# Patient Record
Sex: Male | Born: 1965
Health system: Southern US, Community
[De-identification: ages and names within clinical notes are randomized; demographics above are authoritative.]

## PROBLEM LIST (undated history)

## (undated) DIAGNOSIS — M1990 Unspecified osteoarthritis, unspecified site: Secondary | ICD-10-CM

## (undated) DIAGNOSIS — M109 Gout, unspecified: Secondary | ICD-10-CM

## (undated) DIAGNOSIS — I10 Essential (primary) hypertension: Secondary | ICD-10-CM

## (undated) HISTORY — PX: DISTAL BICEPS TENDON REPAIR: SHX1461

## (undated) HISTORY — PX: ANTERIOR FUSION CERVICAL SPINE: SUR626

## (undated) HISTORY — PX: SHOULDER ARTHROSCOPY W/ ROTATOR CUFF REPAIR: SHX2400

## (undated) HISTORY — PX: KNEE RECONSTRUCTION, MEDIAL PATELLAR FEMORAL LIGAMENT: SHX1898

## (undated) HISTORY — PX: TOE ARTHROPLASTY: SHX6504

## (undated) HISTORY — PX: ACHILLES TENDON SURGERY: SHX542

## (undated) HISTORY — PX: EYE SURGERY: SHX253

---

## 1998-09-03 ENCOUNTER — Emergency Department (HOSPITAL_COMMUNITY): Admission: EM | Admit: 1998-09-03 | Discharge: 1998-09-04 | Payer: Self-pay | Admitting: Emergency Medicine

## 2006-05-28 ENCOUNTER — Encounter: Admission: RE | Admit: 2006-05-28 | Discharge: 2006-05-28 | Payer: Self-pay | Admitting: Internal Medicine

## 2008-03-29 IMAGING — US US ABDOMEN COMPLETE
1 series · 14 of 25 positions shown · non-contrast
Comparison: None.

CLINICAL DATA: Elevated LFT?s.
 ABDOMEN ULTRASOUND:
TECHNIQUE: Complete abdominal ultrasound examination was performed including evaluation of the liver, gallbladder, bile ducts, pancreas, kidneys, spleen, IVC, and abdominal aorta.

[Series 1: unknown · 0.41mm/px · 14 of 85 slices shown]
[im 1/85]
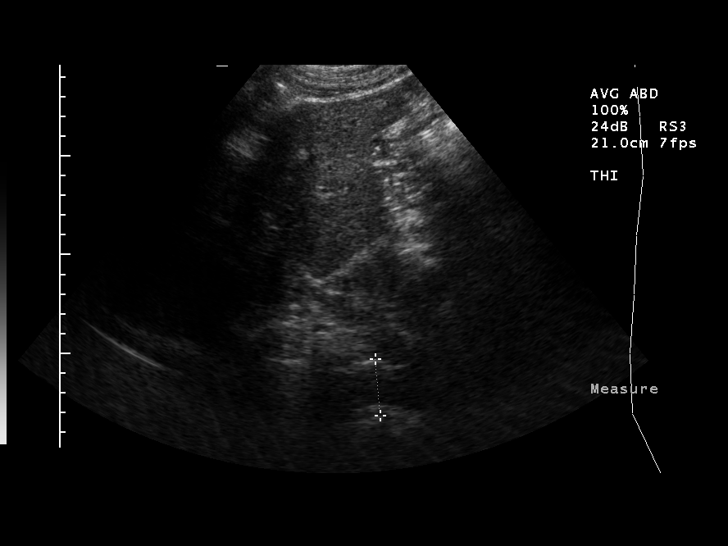
[im 8/85]
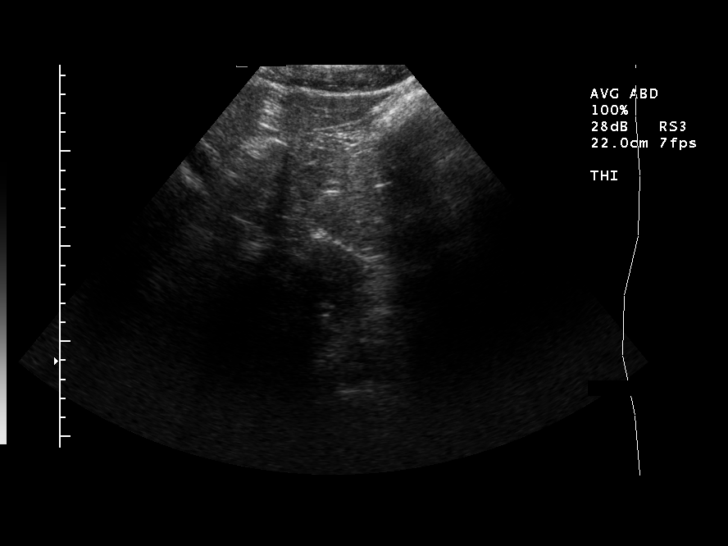
[im 15/85]
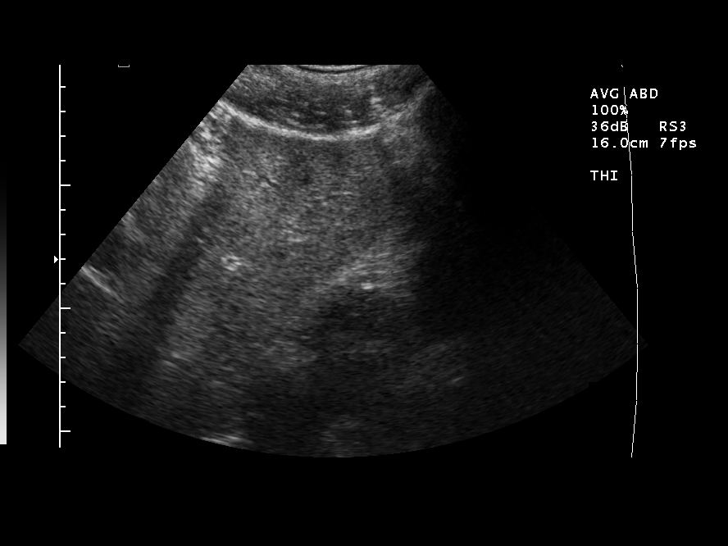
[im 22/85]
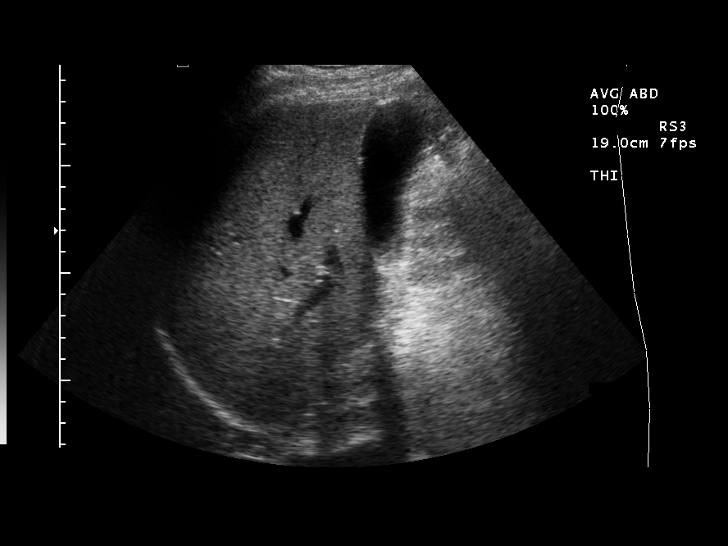
[im 29/85]
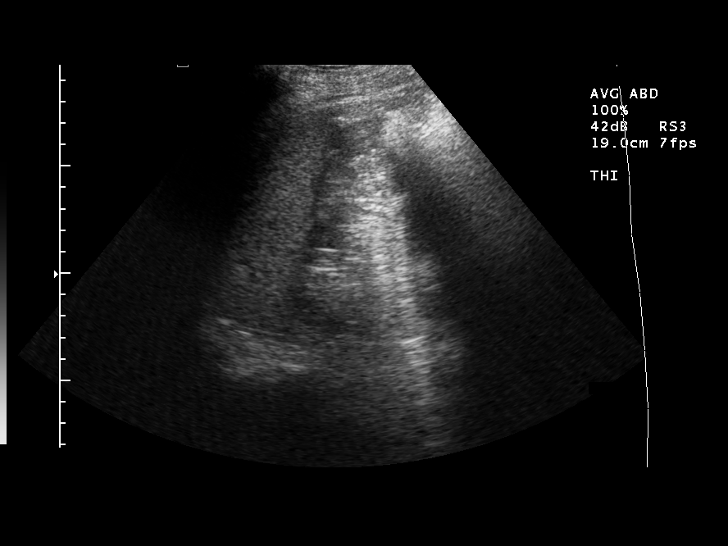
[im 32/85]
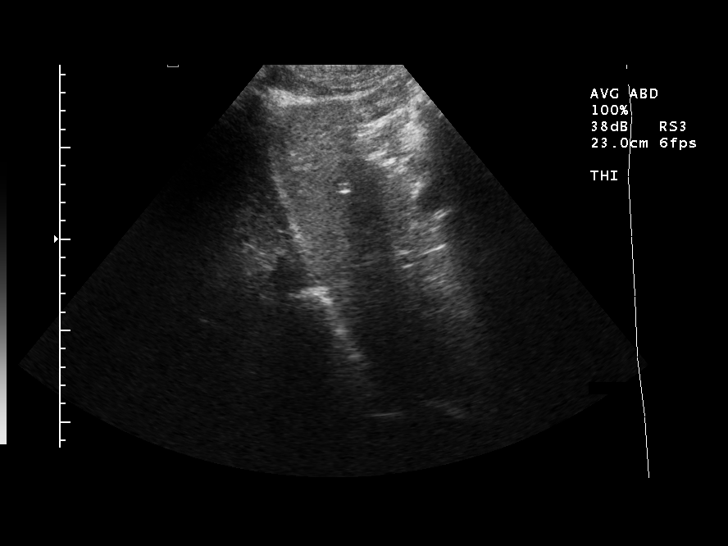
[im 39/85]
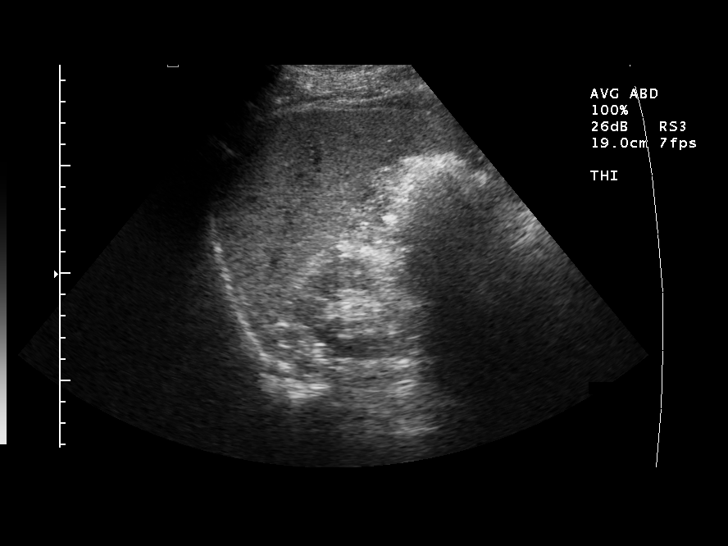
[im 46/85]
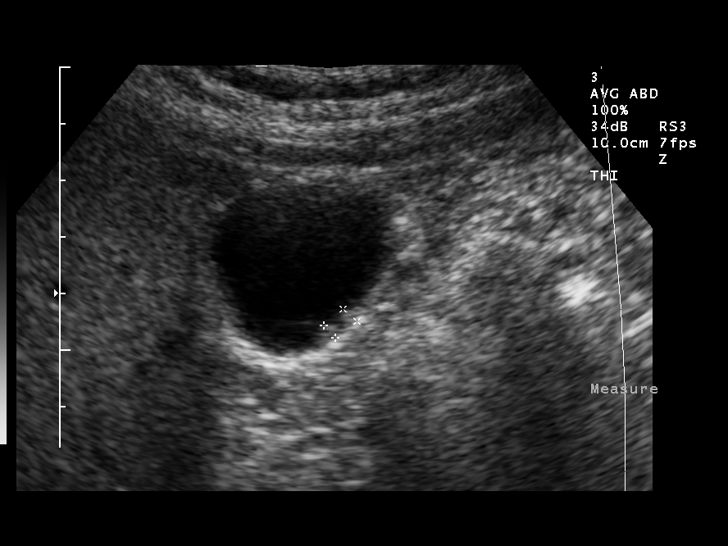
[im 53/85]
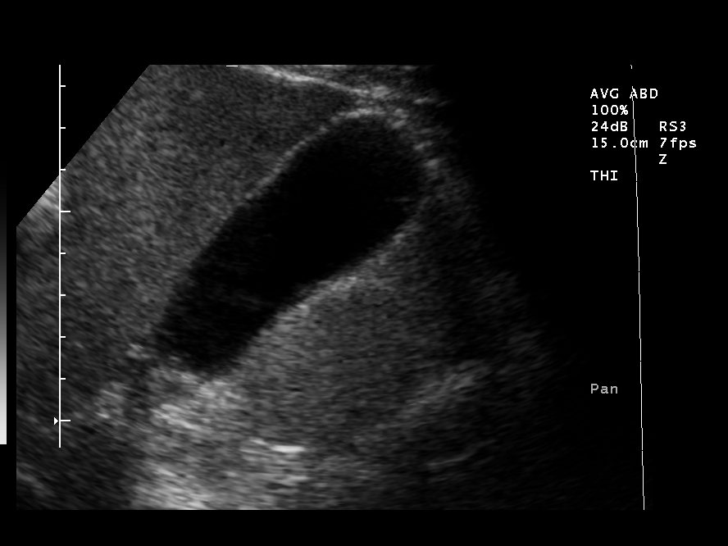
[im 57/85]
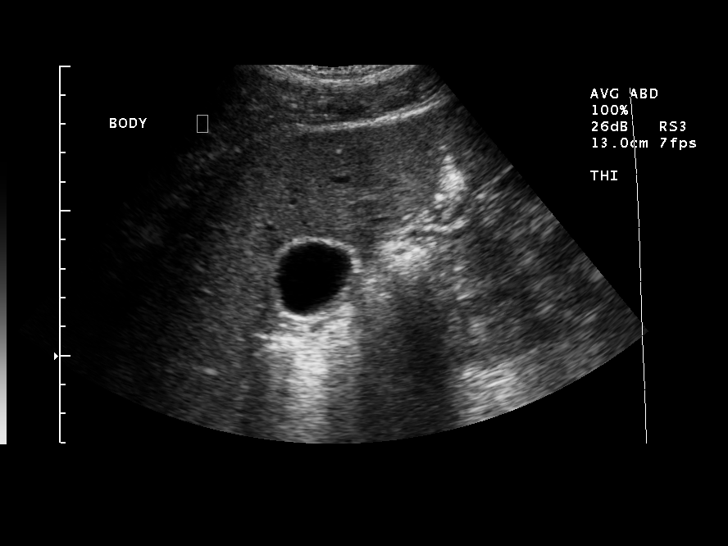
[im 64/85]
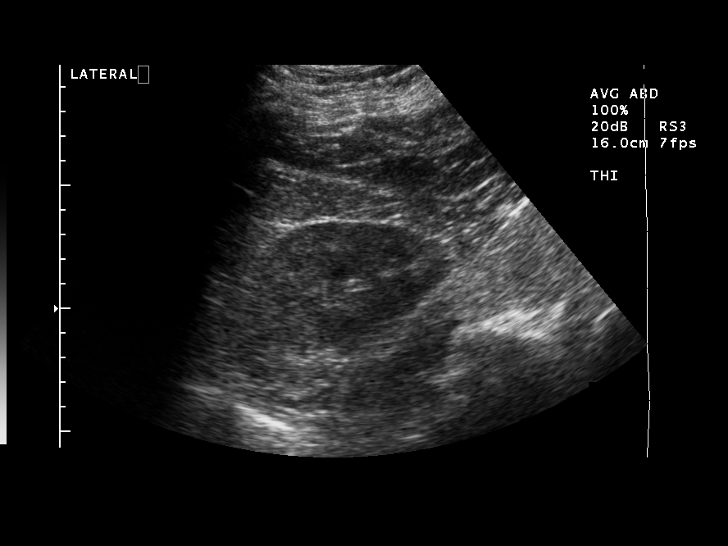
[im 71/85]
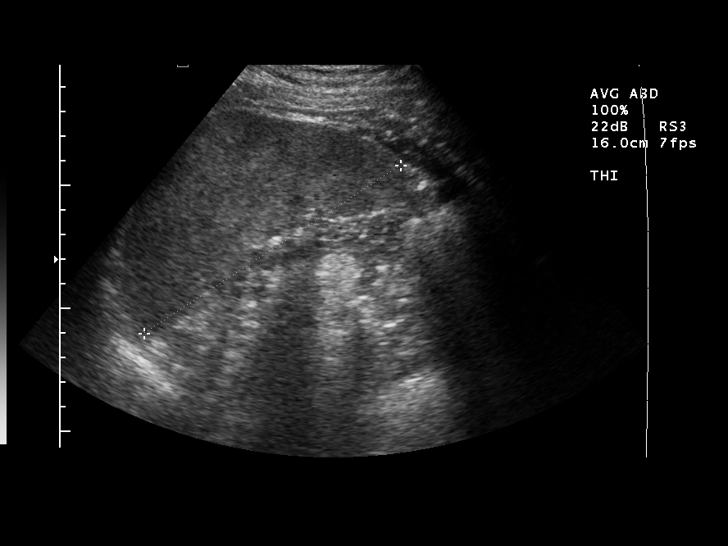
[im 78/85]
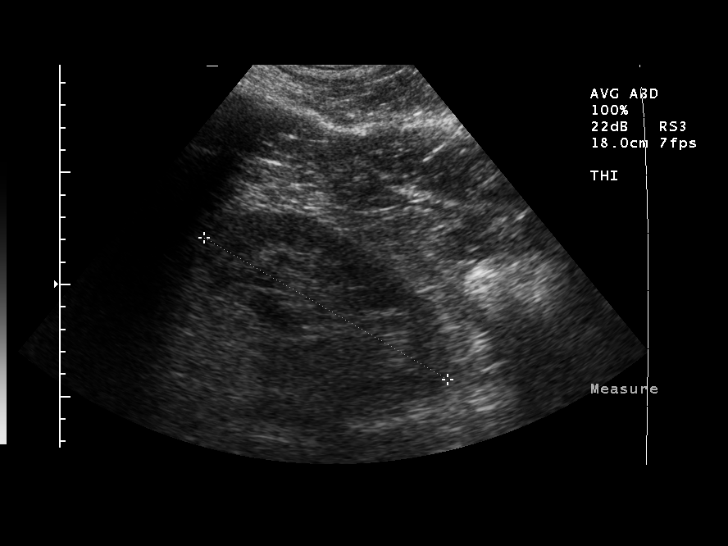
[im 85/85]
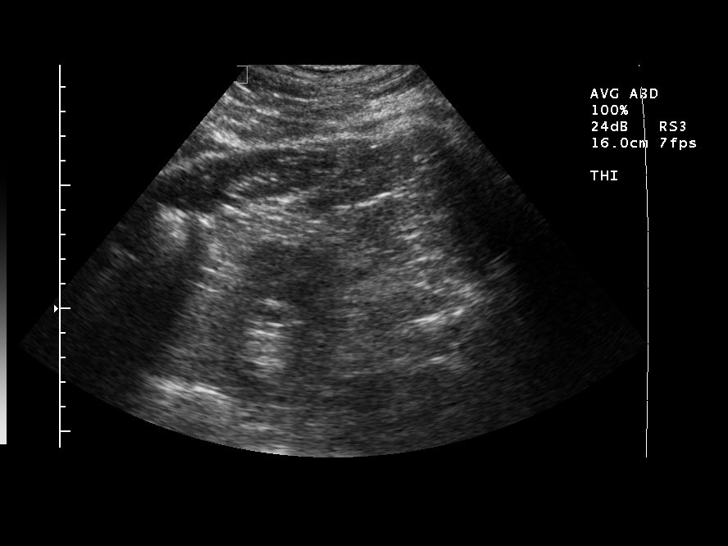

[14 of 25 positions shown; findings below may reference images not displayed]

FINDINGS: The liver is slightly increased in echogenicity diffusely.  There are small polyps along the gallbladder wall which measure less than 5 mm.  Gallbladder wall is not thickened.  No gallstones, pericholecystic fluid, or sonographic Murphy?s sign.  Extrahepatic bile duct measures 3 mm.  IVC and pancreas are not well-visualized.  The spleen measures 12.4 cm.  Kidneys unremarkable.  Abdominal aorta is not well-visualized.  Proximal aorta measures 2.9 cm.
IMPRESSION: 1.  Fatty liver. 
 2.  Mild splenomegaly. 
 3.  Small gallbladder polyps.

## 2008-06-04 ENCOUNTER — Encounter: Admission: RE | Admit: 2008-06-04 | Discharge: 2008-06-04 | Payer: Self-pay | Admitting: Family Medicine

## 2008-11-20 ENCOUNTER — Ambulatory Visit (HOSPITAL_COMMUNITY): Admission: RE | Admit: 2008-11-20 | Discharge: 2008-11-21 | Payer: Self-pay | Admitting: Neurosurgery

## 2010-07-02 LAB — BASIC METABOLIC PANEL
Calcium: 10.2 mg/dL (ref 8.4–10.5)
GFR calc non Af Amer: >60 mL/min (ref >60)
GFR calc non Af Amer: >60 mL/min (ref >60)
Glucose, Bld: 101 mg/dL — ABNORMAL HIGH (ref 70–99)
Glucose, Bld: 96 mg/dL (ref 70–99)
Potassium: 4 mEq/L (ref 3.5–5.1)
Sodium: 136 mEq/L (ref 135–145)
Sodium: 138 mEq/L (ref 135–145)

## 2010-07-02 LAB — CBC
Hemoglobin: 15.8 g/dL (ref 13.0–17.0)
Platelets: 237 10*3/uL (ref 150–400)
RDW: 13.4 % (ref 11.5–15.5)

## 2010-08-09 NOTE — Op Note (Signed)
NAMEDENALI, BECVAR            ACCOUNT NO.:  [id number]   MEDICAL RECORD NO.:  [id number]          PATIENT TYPE:  OIB   LOCATION:  3021                         FACILITY:  [facility]   PHYSICIAN:  Hilda Lias, M.D.   DATE OF BIRTH:  1965-12-28   DATE OF PROCEDURE:  11/20/2008  DATE OF DISCHARGE:                               OPERATIVE REPORT   PREOPERATIVE DIAGNOSES:  1. C5-C6 and C6-C7 degenerative disk disease with chronic      radiculopathy.  2. Ankylosis spondylitis.   POSTOPERATIVE DIAGNOSES:  1. C5-C6 and C6-C7 degenerative disk disease with chronic      radiculopathy.  2. Ankylosis spondylitis.   PROCEDURE:  Anterior C5-C6 and C6-C7 diskectomy, decompression of the  spinal cord, bilateral foraminotomy, interbody fusion with autograft and  allograft plate and microscope.   SURGEON:  Hilda Lias, M.D.   ASSISTANT:  Dr. Newell Coral.   CLINICAL HISTORY:  [name] is a gentleman who has been following in  my office for almost 3 years complaining of neck pain with radiation to  his right upper extremity.  He has a history of ankylosis spondylitis.  The pain is getting worse.  He came with his wife and both of them  decided to go ahead with the surgery.   PROCEDURE IN DETAIL:  The patient was taken to the OR, after intubation,  the left side of the neck was cleaned with DuraPrep.  Transverse  incision was made through the skin, subcutaneous tissue, platysma, down  to the cervical area.  X-ray were done at level of C5-C6 and C6-C7.  We  brought the microscope and we opened the anterior ligament.  The patient  had quite a bit of degenerative disk disease and removal was  accomplished with opening of the posterior ligament and bilateral  foraminotomy.  At the level of C5-6, the disk was completely calcified  and to get into the posterior aspect, we had to use the drill.  At the  end, we were able to open the posterior ligament with decompression of  both thecal nerve  root. The endplate was drilled.  Then 2 pieces of  allograft with 7 mm with autograft inside was inserted just to level  followed by a plate with 6 screws.  Lateral cervical spine showed good  position of the bone graft and plate.  We waited 10 minutes just to be  sure we had good hemostasis.  Once this was accomplished, the wound was  closed with Vicryl and Steri-Strips.           ______________________________  Hilda Lias, M.D.     EB/MEDQ  D:  11/20/2008  T:  11/21/2008  Job:  [id number]

## 2010-08-09 NOTE — H&P (Signed)
NAME:  Gabriel Ballard, Gabriel Ballard            ACCOUNT NO.:  [id number]   MEDICAL RECORD NO.:  [id number]          PATIENT TYPE:  OIB   LOCATION:  3021                         FACILITY:  [facility]   PHYSICIAN:  Hilda Lias, M.D.   DATE OF BIRTH:  [birth year]   DATE OF ADMISSION:  11/20/2008  DATE OF DISCHARGE:                              HISTORY & PHYSICAL   Gabriel Ballard is a gentleman who has been following for more than a year  complaining of neck pain with radiation to the right shoulder and to the  right arm.  At this point, it had been going on since Spring of 2009.  He is not any better.  He has had conservative treatment and he has not  improved.  We repeated the x-ray and showed that in deed he has  spondylosis of the C5-6 and C6-7.  He has a history of ankylosis and  spondylitis.  Because of persistent of pain, he want to proceed with  surgery.   PAST MEDICAL HISTORY:  He has a full shoulder surgery and eye surgery.   ALLERGIES:  He is not allergic to any medication.   FAMILY HISTORY:  Father has a history of high blood pressure and stroke.  Mother with fibromyalgia.   SOCIAL HISTORY:  He does not smoke.  He drinks socially.   REVIEW OF SYSTEMS:  Positive for ankylosis spondylitis, neck pain, arm  pain, high blood pressure.   PHYSICAL EXAMINATION:  HEAD, EARS, NOSE, AND THROAT:  Normal.  NECK:  He has limitation in neck movement secondary to pain.  LUNGS:  Clear.  HEART:  Heart sounds normal.  ABDOMEN:  Normal.  EXTREMITIES:  Normal pulse.  NEUROLOGIC:  He has weakness in the right biceps and wrist extensor.  Reflexes 1+.  No Babinski.  His sensation normal.   1. MRI shows degenerative disk disease at C5-6 and C6-7 with      spondylosis.  2. History of ankylosis spondylitis.   CLINICAL IMPRESSION:  1. Cervical spondylosis C5-6 and C6-7.  2. Ankylosis spondylitis.   RECOMMENDATIONS:  The patient is being admitted for surgery.  Procedure  will be anterior diskectomy and  fusion at C5-6, C6-7 with plate and  graft.  The patient knows about the risks such as infection, CSF leak,  worsening pain, paralysis, need for surgery.           ______________________________  Hilda Lias, M.D.     EB/MEDQ  D:  11/20/2008  T:  11/20/2008  Job:  [id number]

## 2010-09-20 IMAGING — CR DG CHEST 2V
2 series · 2 of 2 positions shown · non-contrast
Comparison: None

CLINICAL DATA: Preadmission

CHEST - 2 VIEW

[view not recorded (1 of 2)]
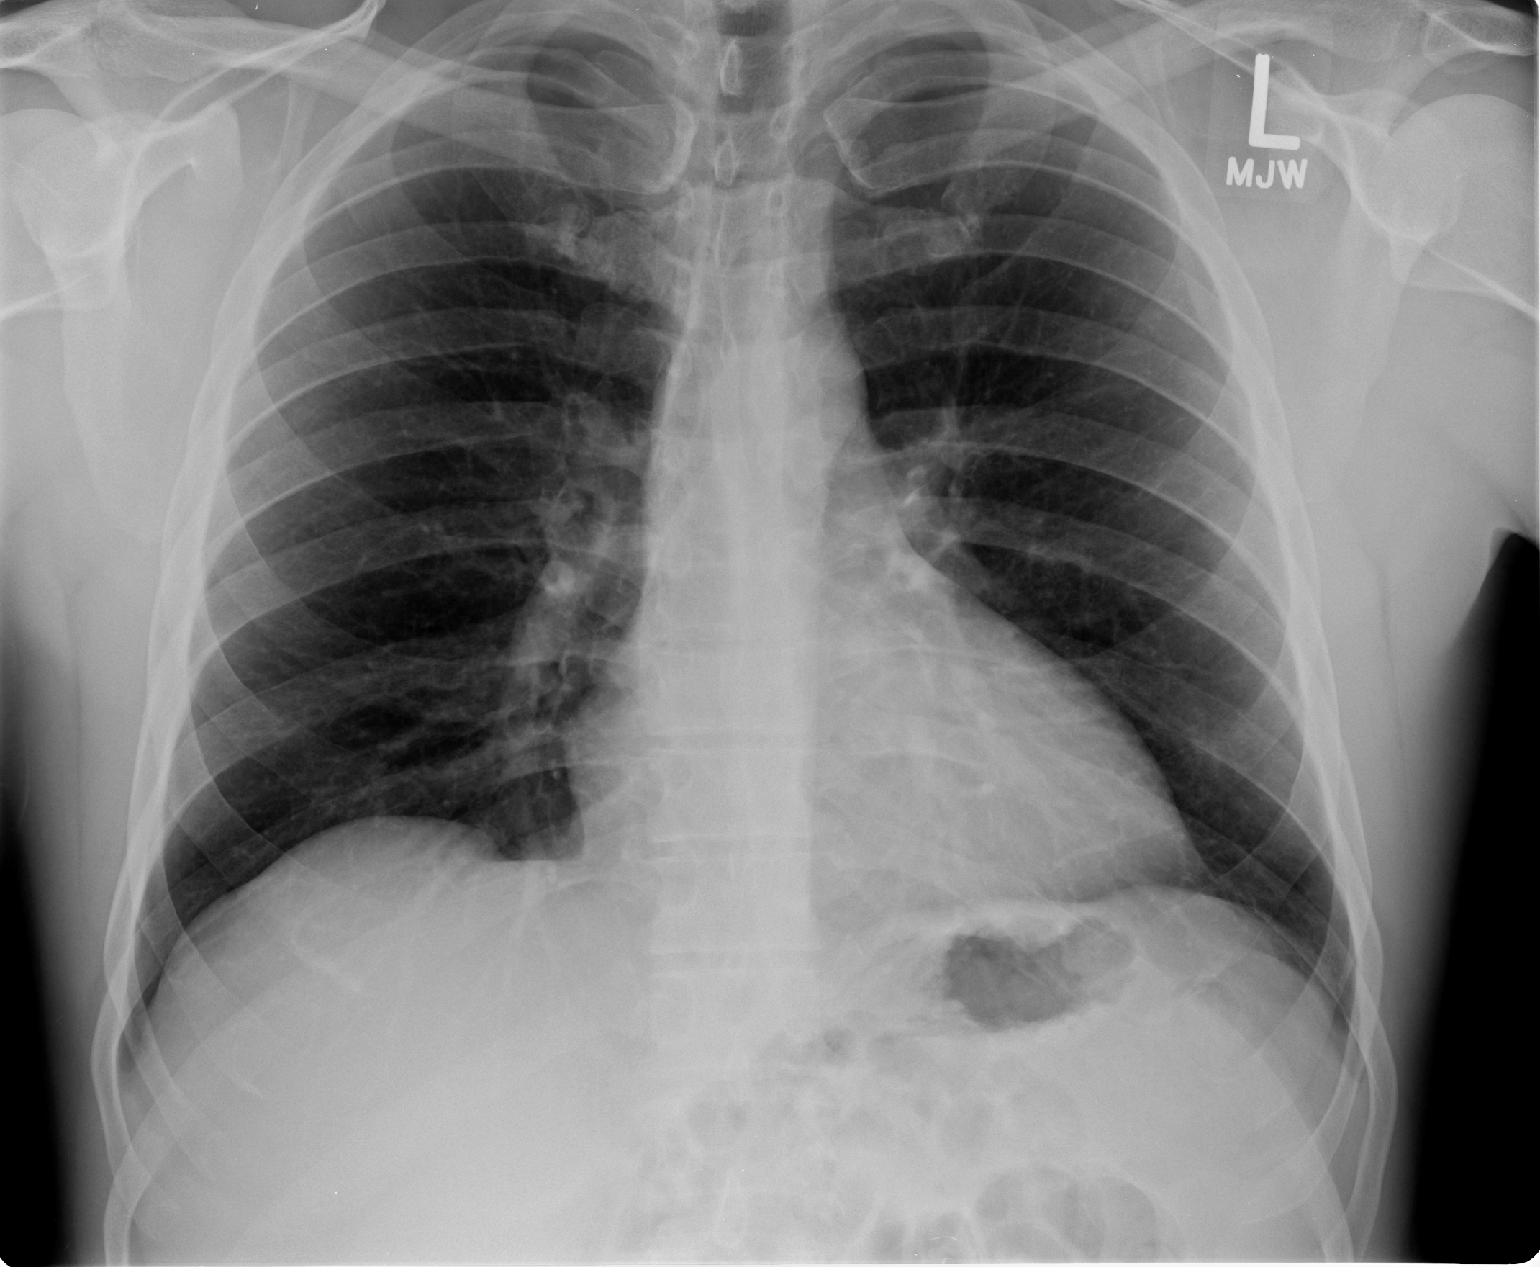

[view not recorded (2 of 2)]
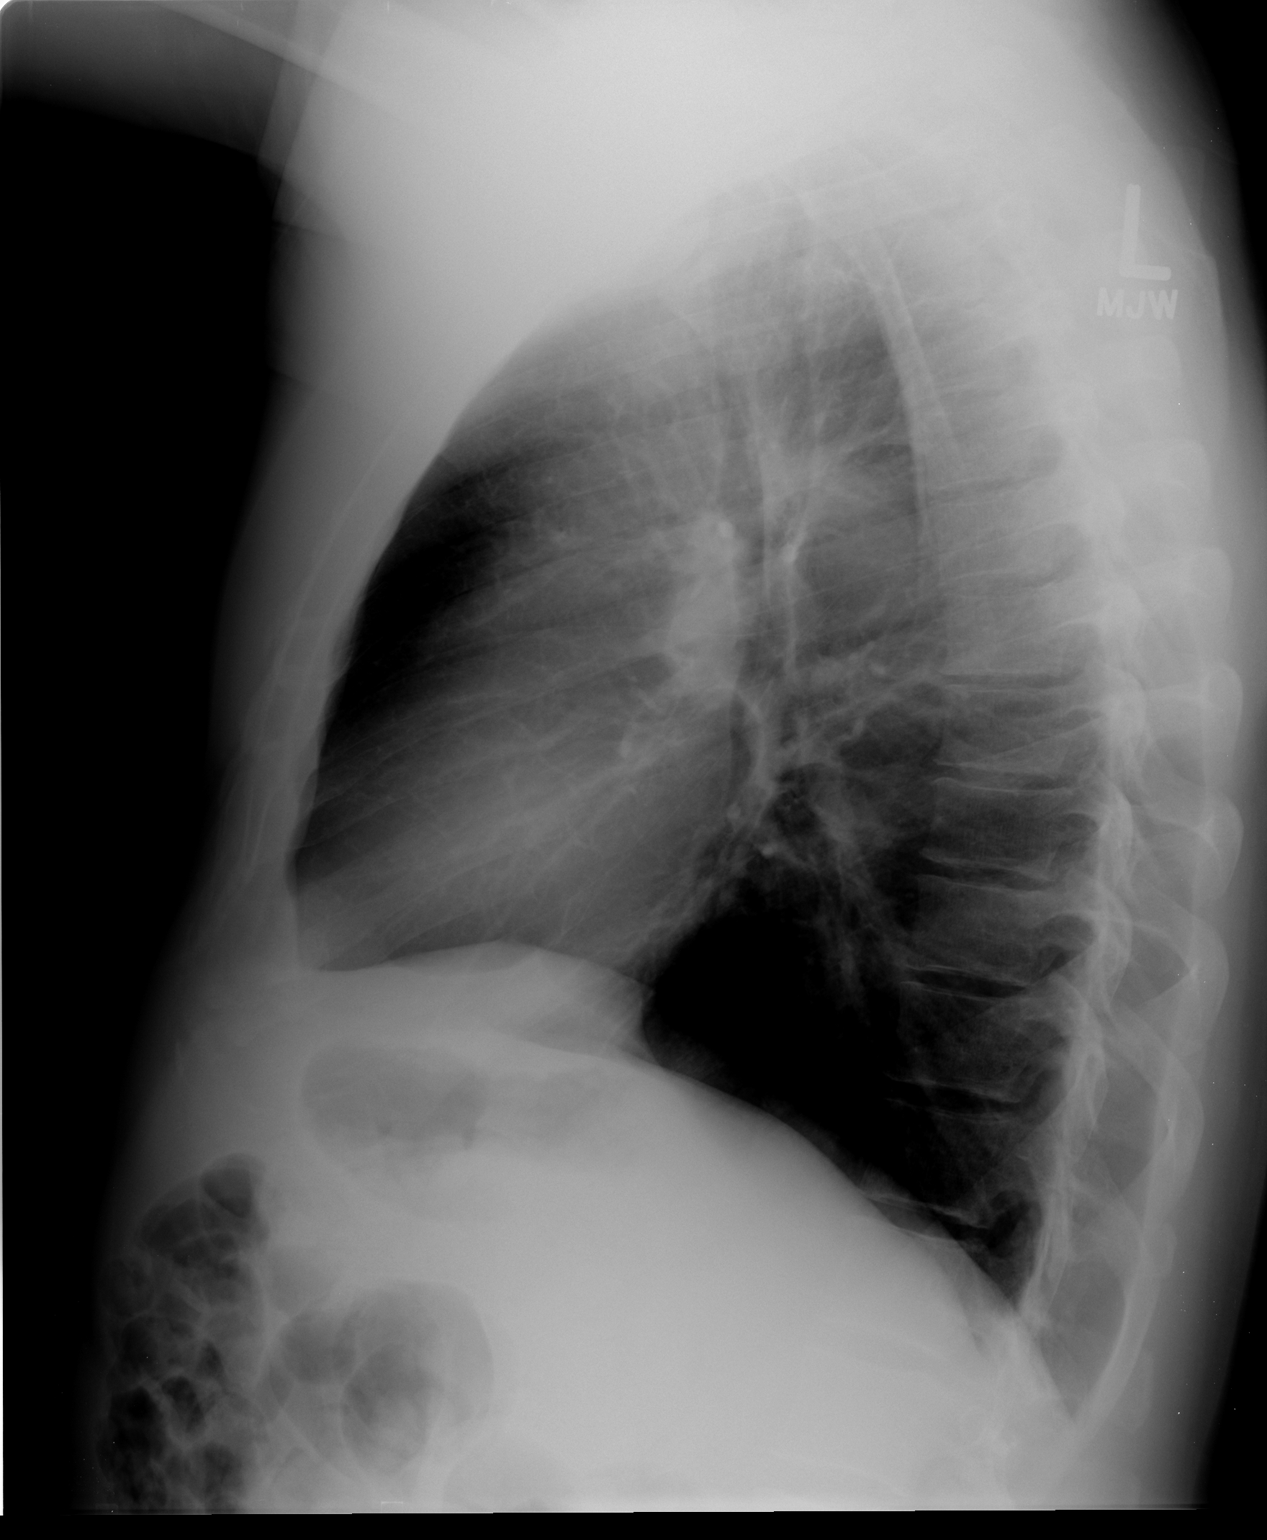

[2 of 2 positions shown; findings below may reference images not displayed]

FINDINGS: Cardiomediastinal silhouette is unremarkable.  No acute
infiltrate or pleural effusion.  No pulmonary edema.  Bony thorax
is unremarkable.
IMPRESSION: No active disease.

## 2014-08-18 DIAGNOSIS — S46219A Strain of muscle, fascia and tendon of other parts of biceps, unspecified arm, initial encounter: Secondary | ICD-10-CM | POA: Insufficient documentation

## 2014-08-25 DIAGNOSIS — Z01818 Encounter for other preprocedural examination: Secondary | ICD-10-CM | POA: Insufficient documentation

## 2015-01-27 DIAGNOSIS — M25562 Pain in left knee: Secondary | ICD-10-CM | POA: Insufficient documentation

## 2015-07-13 DIAGNOSIS — I10 Essential (primary) hypertension: Secondary | ICD-10-CM | POA: Diagnosis not present

## 2015-07-13 DIAGNOSIS — M459 Ankylosing spondylitis of unspecified sites in spine: Secondary | ICD-10-CM | POA: Diagnosis not present

## 2015-07-13 DIAGNOSIS — L409 Psoriasis, unspecified: Secondary | ICD-10-CM | POA: Diagnosis not present

## 2015-07-13 DIAGNOSIS — G4700 Insomnia, unspecified: Secondary | ICD-10-CM | POA: Diagnosis not present

## 2015-08-18 DIAGNOSIS — B078 Other viral warts: Secondary | ICD-10-CM | POA: Diagnosis not present

## 2015-08-18 DIAGNOSIS — D2239 Melanocytic nevi of other parts of face: Secondary | ICD-10-CM | POA: Diagnosis not present

## 2015-08-18 DIAGNOSIS — D225 Melanocytic nevi of trunk: Secondary | ICD-10-CM | POA: Diagnosis not present

## 2015-08-18 DIAGNOSIS — D485 Neoplasm of uncertain behavior of skin: Secondary | ICD-10-CM | POA: Diagnosis not present

## 2015-09-07 DIAGNOSIS — D235 Other benign neoplasm of skin of trunk: Secondary | ICD-10-CM | POA: Diagnosis not present

## 2015-09-07 DIAGNOSIS — D485 Neoplasm of uncertain behavior of skin: Secondary | ICD-10-CM | POA: Diagnosis not present

## 2015-11-08 DIAGNOSIS — Z4789 Encounter for other orthopedic aftercare: Secondary | ICD-10-CM | POA: Diagnosis not present

## 2015-11-08 DIAGNOSIS — M7702 Medial epicondylitis, left elbow: Secondary | ICD-10-CM | POA: Diagnosis not present

## 2015-12-02 DIAGNOSIS — Z79899 Other long term (current) drug therapy: Secondary | ICD-10-CM | POA: Diagnosis not present

## 2015-12-02 DIAGNOSIS — M150 Primary generalized (osteo)arthritis: Secondary | ICD-10-CM | POA: Diagnosis not present

## 2015-12-02 DIAGNOSIS — M1A09X0 Idiopathic chronic gout, multiple sites, without tophus (tophi): Secondary | ICD-10-CM | POA: Diagnosis not present

## 2015-12-02 DIAGNOSIS — M450 Ankylosing spondylitis of multiple sites in spine: Secondary | ICD-10-CM | POA: Diagnosis not present

## 2015-12-06 DIAGNOSIS — H20011 Primary iridocyclitis, right eye: Secondary | ICD-10-CM | POA: Diagnosis not present

## 2015-12-10 DIAGNOSIS — H20011 Primary iridocyclitis, right eye: Secondary | ICD-10-CM | POA: Diagnosis not present

## 2015-12-14 DIAGNOSIS — H20011 Primary iridocyclitis, right eye: Secondary | ICD-10-CM | POA: Diagnosis not present

## 2015-12-15 DIAGNOSIS — H209 Unspecified iridocyclitis: Secondary | ICD-10-CM | POA: Diagnosis not present

## 2015-12-15 DIAGNOSIS — H4710 Unspecified papilledema: Secondary | ICD-10-CM | POA: Diagnosis not present

## 2015-12-21 DIAGNOSIS — H209 Unspecified iridocyclitis: Secondary | ICD-10-CM | POA: Diagnosis not present

## 2015-12-29 DIAGNOSIS — H209 Unspecified iridocyclitis: Secondary | ICD-10-CM | POA: Diagnosis not present

## 2016-01-31 DIAGNOSIS — I10 Essential (primary) hypertension: Secondary | ICD-10-CM | POA: Diagnosis not present

## 2016-02-02 DIAGNOSIS — H209 Unspecified iridocyclitis: Secondary | ICD-10-CM | POA: Diagnosis not present

## 2016-03-13 DIAGNOSIS — H5213 Myopia, bilateral: Secondary | ICD-10-CM | POA: Diagnosis not present

## 2016-03-28 DIAGNOSIS — M150 Primary generalized (osteo)arthritis: Secondary | ICD-10-CM | POA: Diagnosis not present

## 2016-03-28 DIAGNOSIS — M1A09X0 Idiopathic chronic gout, multiple sites, without tophus (tophi): Secondary | ICD-10-CM | POA: Diagnosis not present

## 2016-03-28 DIAGNOSIS — Z79899 Other long term (current) drug therapy: Secondary | ICD-10-CM | POA: Diagnosis not present

## 2016-03-28 DIAGNOSIS — M450 Ankylosing spondylitis of multiple sites in spine: Secondary | ICD-10-CM | POA: Diagnosis not present

## 2016-06-01 DIAGNOSIS — M450 Ankylosing spondylitis of multiple sites in spine: Secondary | ICD-10-CM | POA: Diagnosis not present

## 2016-06-01 DIAGNOSIS — M1A09X0 Idiopathic chronic gout, multiple sites, without tophus (tophi): Secondary | ICD-10-CM | POA: Diagnosis not present

## 2016-06-01 DIAGNOSIS — Z79899 Other long term (current) drug therapy: Secondary | ICD-10-CM | POA: Diagnosis not present

## 2016-06-01 DIAGNOSIS — M25441 Effusion, right hand: Secondary | ICD-10-CM | POA: Diagnosis not present

## 2016-06-01 DIAGNOSIS — M150 Primary generalized (osteo)arthritis: Secondary | ICD-10-CM | POA: Diagnosis not present

## 2016-07-03 DIAGNOSIS — Z79899 Other long term (current) drug therapy: Secondary | ICD-10-CM | POA: Diagnosis not present

## 2016-07-31 DIAGNOSIS — L409 Psoriasis, unspecified: Secondary | ICD-10-CM | POA: Diagnosis not present

## 2016-07-31 DIAGNOSIS — M459 Ankylosing spondylitis of unspecified sites in spine: Secondary | ICD-10-CM | POA: Diagnosis not present

## 2016-07-31 DIAGNOSIS — F5101 Primary insomnia: Secondary | ICD-10-CM | POA: Diagnosis not present

## 2016-07-31 DIAGNOSIS — I10 Essential (primary) hypertension: Secondary | ICD-10-CM | POA: Diagnosis not present

## 2016-09-12 DIAGNOSIS — M25522 Pain in left elbow: Secondary | ICD-10-CM | POA: Diagnosis not present

## 2016-09-12 DIAGNOSIS — M1A09X0 Idiopathic chronic gout, multiple sites, without tophus (tophi): Secondary | ICD-10-CM | POA: Diagnosis not present

## 2016-09-12 DIAGNOSIS — M450 Ankylosing spondylitis of multiple sites in spine: Secondary | ICD-10-CM | POA: Diagnosis not present

## 2016-09-12 DIAGNOSIS — M25521 Pain in right elbow: Secondary | ICD-10-CM | POA: Diagnosis not present

## 2016-12-14 DIAGNOSIS — M150 Primary generalized (osteo)arthritis: Secondary | ICD-10-CM | POA: Diagnosis not present

## 2016-12-14 DIAGNOSIS — M450 Ankylosing spondylitis of multiple sites in spine: Secondary | ICD-10-CM | POA: Diagnosis not present

## 2016-12-14 DIAGNOSIS — M25522 Pain in left elbow: Secondary | ICD-10-CM | POA: Diagnosis not present

## 2016-12-14 DIAGNOSIS — M1A09X0 Idiopathic chronic gout, multiple sites, without tophus (tophi): Secondary | ICD-10-CM | POA: Diagnosis not present

## 2016-12-14 DIAGNOSIS — E663 Overweight: Secondary | ICD-10-CM | POA: Diagnosis not present

## 2016-12-14 DIAGNOSIS — M25561 Pain in right knee: Secondary | ICD-10-CM | POA: Diagnosis not present

## 2017-01-25 DIAGNOSIS — F5101 Primary insomnia: Secondary | ICD-10-CM | POA: Diagnosis not present

## 2017-01-25 DIAGNOSIS — J0100 Acute maxillary sinusitis, unspecified: Secondary | ICD-10-CM | POA: Diagnosis not present

## 2017-01-25 DIAGNOSIS — G4700 Insomnia, unspecified: Secondary | ICD-10-CM | POA: Diagnosis not present

## 2017-01-25 DIAGNOSIS — Z125 Encounter for screening for malignant neoplasm of prostate: Secondary | ICD-10-CM | POA: Diagnosis not present

## 2017-01-25 DIAGNOSIS — I10 Essential (primary) hypertension: Secondary | ICD-10-CM | POA: Diagnosis not present

## 2017-01-25 DIAGNOSIS — Z0000 Encounter for general adult medical examination without abnormal findings: Secondary | ICD-10-CM | POA: Diagnosis not present

## 2017-02-23 DIAGNOSIS — K649 Unspecified hemorrhoids: Secondary | ICD-10-CM | POA: Diagnosis not present

## 2017-02-23 DIAGNOSIS — K635 Polyp of colon: Secondary | ICD-10-CM | POA: Diagnosis not present

## 2017-02-23 DIAGNOSIS — Z1211 Encounter for screening for malignant neoplasm of colon: Secondary | ICD-10-CM | POA: Diagnosis not present

## 2017-02-23 DIAGNOSIS — D125 Benign neoplasm of sigmoid colon: Secondary | ICD-10-CM | POA: Diagnosis not present

## 2017-04-01 DIAGNOSIS — S61305A Unspecified open wound of left ring finger with damage to nail, initial encounter: Secondary | ICD-10-CM | POA: Diagnosis not present

## 2017-06-13 DIAGNOSIS — M25521 Pain in right elbow: Secondary | ICD-10-CM | POA: Diagnosis not present

## 2017-06-13 DIAGNOSIS — M450 Ankylosing spondylitis of multiple sites in spine: Secondary | ICD-10-CM | POA: Diagnosis not present

## 2017-06-13 DIAGNOSIS — M1A09X0 Idiopathic chronic gout, multiple sites, without tophus (tophi): Secondary | ICD-10-CM | POA: Diagnosis not present

## 2017-06-13 DIAGNOSIS — M25522 Pain in left elbow: Secondary | ICD-10-CM | POA: Diagnosis not present

## 2017-06-20 DIAGNOSIS — H20012 Primary iridocyclitis, left eye: Secondary | ICD-10-CM | POA: Diagnosis not present

## 2017-06-20 DIAGNOSIS — M459 Ankylosing spondylitis of unspecified sites in spine: Secondary | ICD-10-CM | POA: Diagnosis not present

## 2017-07-31 DIAGNOSIS — G4700 Insomnia, unspecified: Secondary | ICD-10-CM | POA: Diagnosis not present

## 2017-07-31 DIAGNOSIS — F5101 Primary insomnia: Secondary | ICD-10-CM | POA: Diagnosis not present

## 2017-07-31 DIAGNOSIS — I10 Essential (primary) hypertension: Secondary | ICD-10-CM | POA: Diagnosis not present

## 2017-07-31 DIAGNOSIS — M459 Ankylosing spondylitis of unspecified sites in spine: Secondary | ICD-10-CM | POA: Diagnosis not present

## 2017-12-13 DIAGNOSIS — M1A09X0 Idiopathic chronic gout, multiple sites, without tophus (tophi): Secondary | ICD-10-CM | POA: Diagnosis not present

## 2017-12-13 DIAGNOSIS — M450 Ankylosing spondylitis of multiple sites in spine: Secondary | ICD-10-CM | POA: Diagnosis not present

## 2018-02-18 ENCOUNTER — Other Ambulatory Visit: Payer: Self-pay | Admitting: Family Medicine

## 2018-02-18 ENCOUNTER — Ambulatory Visit
Admission: RE | Admit: 2018-02-18 | Discharge: 2018-02-18 | Disposition: A | Discharge status: Home / Self Care | Payer: BLUE CROSS/BLUE SHIELD | Source: Ambulatory Visit | Attending: Family Medicine | Admitting: Family Medicine

## 2018-02-18 DIAGNOSIS — L03032 Cellulitis of left toe: Secondary | ICD-10-CM | POA: Diagnosis not present

## 2018-02-18 DIAGNOSIS — S91302A Unspecified open wound, left foot, initial encounter: Secondary | ICD-10-CM | POA: Diagnosis not present

## 2018-02-20 DIAGNOSIS — M450 Ankylosing spondylitis of multiple sites in spine: Secondary | ICD-10-CM | POA: Diagnosis not present

## 2018-03-13 ENCOUNTER — Other Ambulatory Visit: Payer: Self-pay | Admitting: Podiatry

## 2018-03-13 ENCOUNTER — Ambulatory Visit (INDEPENDENT_AMBULATORY_CARE_PROVIDER_SITE_OTHER): Payer: BLUE CROSS/BLUE SHIELD

## 2018-03-13 ENCOUNTER — Ambulatory Visit: Payer: BLUE CROSS/BLUE SHIELD | Admitting: Podiatry

## 2018-03-13 ENCOUNTER — Encounter: Payer: Self-pay | Admitting: Podiatry

## 2018-03-13 VITALS — BP 144/90 | HR 73

## 2018-03-13 DIAGNOSIS — M779 Enthesopathy, unspecified: Secondary | ICD-10-CM

## 2018-03-13 DIAGNOSIS — M7752 Other enthesopathy of left foot: Secondary | ICD-10-CM

## 2018-03-13 DIAGNOSIS — M79671 Pain in right foot: Secondary | ICD-10-CM

## 2018-03-13 DIAGNOSIS — M7751 Other enthesopathy of right foot: Secondary | ICD-10-CM

## 2018-03-13 DIAGNOSIS — M79672 Pain in left foot: Secondary | ICD-10-CM

## 2018-03-13 DIAGNOSIS — M2042 Other hammer toe(s) (acquired), left foot: Secondary | ICD-10-CM

## 2018-03-13 MED ORDER — TRIAMCINOLONE ACETONIDE 10 MG/ML IJ SUSP
10.0000 mg | Freq: Once | INTRAMUSCULAR | Status: AC
  Administered 2018-03-13: 10 mg

## 2018-03-13 NOTE — Progress Notes (Signed)
Subjective:   Patient ID: Gabriel GraffMichael W Charon, male   DOB: 52 y.o.   MRN: [id number]   HPI Patient states he is having a lot of pain between the fourth and fifth toes on his left foot and it is been going on at least 2 months.  Does not remember injury and states that they tried cream which has not been effective.  Patient does not smoke and likes to be active   Review of Systems  All other systems reviewed and are negative.       Objective:  Physical Exam Vitals signs and nursing note reviewed.  Constitutional:      Appearance: He is well-developed.  Pulmonary:     Effort: Pulmonary effort is normal.  Musculoskeletal: Normal range of motion.  Skin:    General: Skin is warm.  Neurological:     Mental Status: He is alert.     Neurovascular status intact muscle strength adequate patient found on the left fifth digit to have a small opening that is irritated with keratotic tissue base of fifth digit fourth digit with inflammation of the interspace.  Patient has good digital perfusion well oriented x3     Assessment:  Interspace lesion fifth left with no erythema or drainage or odor noted with pain     Plan:  Probability for chronic skin damage with hammertoe deformity fifth digit left creating problems.  Patient also has probable inflammatory capsulitis and today I did do a careful injection of the fourth MPJ 2 mg dexamethasone Kenalog 5 mg Xylocaine I applied a small bit of medicine to the area on the fifth digit advised on padding therapy soaks therapy and we will see back in 3 weeks with probability this will require partial syndactylization hammertoe repair  X-ray indicates there is slight enlargement had a proximal phalanx fifth digit left with no other pathology

## 2018-03-22 DIAGNOSIS — M450 Ankylosing spondylitis of multiple sites in spine: Secondary | ICD-10-CM | POA: Diagnosis not present

## 2018-04-03 ENCOUNTER — Encounter: Payer: Self-pay | Admitting: Podiatry

## 2018-04-03 ENCOUNTER — Ambulatory Visit: Payer: BLUE CROSS/BLUE SHIELD | Admitting: Podiatry

## 2018-04-03 DIAGNOSIS — M2042 Other hammer toe(s) (acquired), left foot: Secondary | ICD-10-CM

## 2018-04-03 DIAGNOSIS — M79672 Pain in left foot: Secondary | ICD-10-CM | POA: Diagnosis not present

## 2018-04-03 NOTE — Progress Notes (Signed)
Subjective:   Patient ID: Bea Graff, male   DOB: 53 y.o.   MRN: [id number]   HPI Patient presents stating I have his chronic area between my fifth toe fourth toe left but only got better for several weeks after the injection and I need to get it fixed as it is continuing to be very tender and almost impossible to wear shoe gear with   ROS      Objective:  Physical Exam  Neurovascular status intact with chronic interspace lesion fourth interspace left with very painful and probable hypertrophy of the head of proximal phalanx fifth digit left with pain also on the lateral side of the toe     Assessment:  Chronic soft tissue lesion fourth interspace left with hammertoe deformity fifth digit left is painful     Plan:  H&P conditions reviewed and I recommended a partial syndactylization with arthroplasty fifth toe.  I explained the procedure and risk and patient wants surgery understanding risk and understands no long-term guarantees this will get better and all possible complications that can occur.  Today I went ahead and I have patient signed consent form after extensive review and the patient is scheduled for outpatient surgery after reading over the consent form and discussing with me.  Understands total recovery can take 3 months to 6 months and is encouraged to call with any questions prior to procedure

## 2018-04-03 NOTE — Patient Instructions (Signed)
Pre-Operative Instructions  Congratulations, you have decided to take an important step towards improving your quality of life.  You can be assured that the doctors and staff at [facility] will be with you every step of the way.  Here are some important things you should know:  1. Plan to be at the surgery center/hospital at least 1 (one) hour prior to your scheduled time, unless otherwise directed by the surgical center/hospital staff.  You must have a responsible adult accompany you, remain during the surgery and drive you home.  Make sure you have directions to the surgical center/hospital to ensure you arrive on time. 2. If you are having surgery at [facility] or [facility], you will need a copy of your medical history and physical form from your family physician within one month prior to the date of surgery. We will give you a form for your primary physician to complete.  3. We make every effort to accommodate the date you request for surgery.  However, there are times where surgery dates or times have to be moved.  We will contact you as soon as possible if a change in schedule is required.   4. No aspirin/ibuprofen for one week before surgery.  If you are on aspirin, any non-steroidal anti-inflammatory medications (Mobic, Aleve, Ibuprofen) should not be taken seven (7) days prior to your surgery.  You make take Tylenol for pain prior to surgery.  5. Medications - If you are taking daily heart and blood pressure medications, seizure, reflux, allergy, asthma, anxiety, pain or diabetes medications, make sure you notify the surgery center/hospital before the day of surgery so they can tell you which medications you should take or avoid the day of surgery. 6. No food or drink after midnight the night before surgery unless directed otherwise by surgical center/hospital staff. 7. No alcoholic beverages 24-hours prior to surgery.  No smoking 24-hours prior or 24-hours after  surgery. 8. Wear loose pants or shorts. They should be loose enough to fit over bandages, boots, and casts. 9. Don't wear slip-on shoes. Sneakers are preferred. 10. Bring your boot with you to the surgery center/hospital.  Also bring crutches or a walker if your physician has prescribed it for you.  If you do not have this equipment, it will be provided for you after surgery. 11. If you have not been contacted by the surgery center/hospital by the day before your surgery, call to confirm the date and time of your surgery. 12. Leave-time from work may vary depending on the type of surgery you have.  Appropriate arrangements should be made prior to surgery with your employer. 13. Prescriptions will be provided immediately following surgery by your doctor.  Fill these as soon as possible after surgery and take the medication as directed. Pain medications will not be refilled on weekends and must be approved by the doctor. 14. Remove nail polish on the operative foot and avoid getting pedicures prior to surgery. 15. Wash the night before surgery.  The night before surgery wash the foot and leg well with water and the antibacterial soap provided. Be sure to pay special attention to beneath the toenails and in between the toes.  Wash for at least three (3) minutes. Rinse thoroughly with water and dry well with a towel.  Perform this wash unless told not to do so by your physician.  Enclosed: 1 Ice pack (please put in freezer the night before surgery)   1 Hibiclens skin cleaner     Pre-op instructions  If you have any questions regarding the instructions, please do not hesitate to call our office.  [place]: [address], , [place] [address] -- [phone]  [place]: [address] [place], [place] [address] -- [phone]  [place]: [address]  [place], [place] [address] -- [phone]  [place]: [address], [address], [place], [place] [address] -- [phone]  Website:  [email or web address] 

## 2018-04-16 ENCOUNTER — Encounter: Payer: Self-pay | Admitting: Podiatry

## 2018-04-16 DIAGNOSIS — L852 Keratosis punctata (palmaris et plantaris): Secondary | ICD-10-CM

## 2018-04-16 DIAGNOSIS — D2122 Benign neoplasm of connective and other soft tissue of left lower limb, including hip: Secondary | ICD-10-CM | POA: Diagnosis not present

## 2018-04-16 DIAGNOSIS — M2042 Other hammer toe(s) (acquired), left foot: Secondary | ICD-10-CM | POA: Diagnosis not present

## 2018-04-16 DIAGNOSIS — I10 Essential (primary) hypertension: Secondary | ICD-10-CM | POA: Diagnosis not present

## 2018-04-24 ENCOUNTER — Ambulatory Visit (INDEPENDENT_AMBULATORY_CARE_PROVIDER_SITE_OTHER): Payer: BLUE CROSS/BLUE SHIELD

## 2018-04-24 ENCOUNTER — Encounter: Payer: Self-pay | Admitting: Podiatry

## 2018-04-24 ENCOUNTER — Ambulatory Visit (INDEPENDENT_AMBULATORY_CARE_PROVIDER_SITE_OTHER): Payer: Self-pay | Admitting: Podiatry

## 2018-04-24 VITALS — BP 146/99 | HR 71 | Temp 98.0°F

## 2018-04-24 DIAGNOSIS — M2042 Other hammer toe(s) (acquired), left foot: Secondary | ICD-10-CM

## 2018-04-25 DIAGNOSIS — J0110 Acute frontal sinusitis, unspecified: Secondary | ICD-10-CM | POA: Diagnosis not present

## 2018-04-25 NOTE — Progress Notes (Signed)
Subjective:   Patient ID: Gabriel Ballard, male   DOB: 53 y.o.   MRN: [id number]   HPI Patient states doing really well with minimal discomfort and walking with normal gait pattern   ROS      Objective:  Physical Exam  Neurovascular status intact negative Homans sign noted with patient's fourth interspace left healing well wound edges well coapted stitches in place     Assessment:  Doing well post partial syndactylization procedure left along with arthroplasty     Plan:  H&P x-ray taken and reapplied sterile dressing with instructions on continuing this for 2 more weeks and if any drainage were to occur to let us know immediately and if not stitches to be removed in 2 weeks  X-ray indicates satisfactory section had a proximal phalanx digit 5 left

## 2018-05-08 ENCOUNTER — Ambulatory Visit (INDEPENDENT_AMBULATORY_CARE_PROVIDER_SITE_OTHER): Payer: BLUE CROSS/BLUE SHIELD

## 2018-05-08 ENCOUNTER — Ambulatory Visit (INDEPENDENT_AMBULATORY_CARE_PROVIDER_SITE_OTHER): Payer: BLUE CROSS/BLUE SHIELD | Admitting: Podiatry

## 2018-05-08 DIAGNOSIS — Z09 Encounter for follow-up examination after completed treatment for conditions other than malignant neoplasm: Secondary | ICD-10-CM

## 2018-05-08 DIAGNOSIS — M2042 Other hammer toe(s) (acquired), left foot: Secondary | ICD-10-CM

## 2018-05-08 NOTE — Progress Notes (Signed)
Subjective:   Patient ID: Gabriel Ballard, male   DOB: 53 y.o.   MRN: [id number]   HPI Patient states that doing fine at been in a regular shoe for around a week   ROS      Objective:  Physical Exam  Neurovascular status intact with fourth interspace left healing well with incision site intact and stitches intact with no drainage or redness     Assessment:  Doing well post syndactylization arthroplasty procedure left     Plan:  H&P reviewed condition and stitches are removed with no gapping and advised to continue to keep this clean it will take several more weeks to heal and to be careful with it.  Reappoint for [place] to recheck  X-ray indicates that this bone is been satisfactory resected and in good alignment

## 2018-05-27 DIAGNOSIS — M450 Ankylosing spondylitis of multiple sites in spine: Secondary | ICD-10-CM | POA: Diagnosis not present

## 2018-06-11 DIAGNOSIS — M450 Ankylosing spondylitis of multiple sites in spine: Secondary | ICD-10-CM | POA: Diagnosis not present

## 2018-06-11 DIAGNOSIS — M1A09X0 Idiopathic chronic gout, multiple sites, without tophus (tophi): Secondary | ICD-10-CM | POA: Diagnosis not present

## 2018-06-11 DIAGNOSIS — M25521 Pain in right elbow: Secondary | ICD-10-CM | POA: Diagnosis not present

## 2018-06-11 DIAGNOSIS — M25522 Pain in left elbow: Secondary | ICD-10-CM | POA: Diagnosis not present

## 2018-07-22 DIAGNOSIS — M450 Ankylosing spondylitis of multiple sites in spine: Secondary | ICD-10-CM | POA: Diagnosis not present

## 2018-09-16 DIAGNOSIS — M450 Ankylosing spondylitis of multiple sites in spine: Secondary | ICD-10-CM | POA: Diagnosis not present

## 2018-11-11 DIAGNOSIS — M450 Ankylosing spondylitis of multiple sites in spine: Secondary | ICD-10-CM | POA: Diagnosis not present

## 2018-12-12 DIAGNOSIS — M25522 Pain in left elbow: Secondary | ICD-10-CM | POA: Diagnosis not present

## 2018-12-12 DIAGNOSIS — M1A09X0 Idiopathic chronic gout, multiple sites, without tophus (tophi): Secondary | ICD-10-CM | POA: Diagnosis not present

## 2018-12-12 DIAGNOSIS — M450 Ankylosing spondylitis of multiple sites in spine: Secondary | ICD-10-CM | POA: Diagnosis not present

## 2018-12-12 DIAGNOSIS — M25521 Pain in right elbow: Secondary | ICD-10-CM | POA: Diagnosis not present

## 2018-12-20 DIAGNOSIS — M1812 Unilateral primary osteoarthritis of first carpometacarpal joint, left hand: Secondary | ICD-10-CM | POA: Diagnosis not present

## 2018-12-20 DIAGNOSIS — M19041 Primary osteoarthritis, right hand: Secondary | ICD-10-CM | POA: Diagnosis not present

## 2019-01-06 DIAGNOSIS — Z79899 Other long term (current) drug therapy: Secondary | ICD-10-CM | POA: Diagnosis not present

## 2019-01-06 DIAGNOSIS — M450 Ankylosing spondylitis of multiple sites in spine: Secondary | ICD-10-CM | POA: Diagnosis not present

## 2019-02-04 DIAGNOSIS — M459 Ankylosing spondylitis of unspecified sites in spine: Secondary | ICD-10-CM | POA: Diagnosis not present

## 2019-02-04 DIAGNOSIS — G4700 Insomnia, unspecified: Secondary | ICD-10-CM | POA: Diagnosis not present

## 2019-02-04 DIAGNOSIS — L409 Psoriasis, unspecified: Secondary | ICD-10-CM | POA: Diagnosis not present

## 2019-02-04 DIAGNOSIS — I10 Essential (primary) hypertension: Secondary | ICD-10-CM | POA: Diagnosis not present

## 2019-03-03 DIAGNOSIS — M450 Ankylosing spondylitis of multiple sites in spine: Secondary | ICD-10-CM | POA: Diagnosis not present

## 2019-04-14 DIAGNOSIS — Z7189 Other specified counseling: Secondary | ICD-10-CM | POA: Diagnosis not present

## 2019-04-14 DIAGNOSIS — J0111 Acute recurrent frontal sinusitis: Secondary | ICD-10-CM | POA: Diagnosis not present

## 2019-05-05 DIAGNOSIS — M450 Ankylosing spondylitis of multiple sites in spine: Secondary | ICD-10-CM | POA: Diagnosis not present

## 2019-06-12 DIAGNOSIS — M150 Primary generalized (osteo)arthritis: Secondary | ICD-10-CM | POA: Diagnosis not present

## 2019-06-12 DIAGNOSIS — M25521 Pain in right elbow: Secondary | ICD-10-CM | POA: Diagnosis not present

## 2019-06-12 DIAGNOSIS — M450 Ankylosing spondylitis of multiple sites in spine: Secondary | ICD-10-CM | POA: Diagnosis not present

## 2019-06-20 ENCOUNTER — Ambulatory Visit: Payer: BC Managed Care – PPO | Attending: Internal Medicine

## 2019-06-20 DIAGNOSIS — Z23 Encounter for immunization: Secondary | ICD-10-CM

## 2019-06-20 NOTE — Progress Notes (Signed)
   Covid-19 Vaccination Clinic  Name:  Gabriel Ballard    MRN: [id number] DOB: 01/04/1966  06/20/2019  Gabriel Ballard was observed post Covid-19 immunization for 15 minutes without incident. He was provided with Vaccine Information Sheet and instruction to access the V-Safe system.   Gabriel Ballard was instructed to call 911 with any severe reactions post vaccine: Marland Kitchen Difficulty breathing  . Swelling of face and throat  . A fast heartbeat  . A bad rash all over body  . Dizziness and weakness   Immunizations Administered    Name Date Dose VIS Date Route   Pfizer COVID-19 Vaccine 06/20/2019  3:00 PM 0.3 mL 03/07/2019 Intramuscular   Manufacturer: [employer], [employer]   Lot: [id number]   NDC: [address]-7

## 2019-07-01 DIAGNOSIS — M450 Ankylosing spondylitis of multiple sites in spine: Secondary | ICD-10-CM | POA: Diagnosis not present

## 2019-07-16 ENCOUNTER — Ambulatory Visit: Payer: BC Managed Care – PPO | Attending: Internal Medicine

## 2019-07-16 DIAGNOSIS — Z23 Encounter for immunization: Secondary | ICD-10-CM

## 2019-07-16 NOTE — Progress Notes (Signed)
   Covid-19 Vaccination Clinic  Name:  Gabriel Ballard    MRN: [id number] DOB: May 15, 1965  07/16/2019  Gabriel Ballard was observed post Covid-19 immunization for 15 minutes without incident. He was provided with Vaccine Information Sheet and instruction to access the V-Safe system.   Gabriel Ballard was instructed to call 911 with any severe reactions post vaccine: Marland Kitchen Difficulty breathing  . Swelling of face and throat  . A fast heartbeat  . A bad rash all over body  . Dizziness and weakness   Immunizations Administered    Name Date Dose VIS Date Route   Pfizer COVID-19 Vaccine 07/16/2019  8:25 AM 0.3 mL 05/21/2018 Intramuscular   Manufacturer: [employer], [employer]   Lot: [id number]   NDC: [address]-7

## 2019-09-02 DIAGNOSIS — M450 Ankylosing spondylitis of multiple sites in spine: Secondary | ICD-10-CM | POA: Diagnosis not present

## 2019-09-05 DIAGNOSIS — F5101 Primary insomnia: Secondary | ICD-10-CM | POA: Diagnosis not present

## 2019-09-05 DIAGNOSIS — Z125 Encounter for screening for malignant neoplasm of prostate: Secondary | ICD-10-CM | POA: Diagnosis not present

## 2019-09-05 DIAGNOSIS — I10 Essential (primary) hypertension: Secondary | ICD-10-CM | POA: Diagnosis not present

## 2019-09-05 DIAGNOSIS — J0111 Acute recurrent frontal sinusitis: Secondary | ICD-10-CM | POA: Diagnosis not present

## 2019-09-05 DIAGNOSIS — M459 Ankylosing spondylitis of unspecified sites in spine: Secondary | ICD-10-CM | POA: Diagnosis not present

## 2019-10-28 DIAGNOSIS — Z79899 Other long term (current) drug therapy: Secondary | ICD-10-CM | POA: Diagnosis not present

## 2019-10-28 DIAGNOSIS — M450 Ankylosing spondylitis of multiple sites in spine: Secondary | ICD-10-CM | POA: Diagnosis not present

## 2019-12-15 DIAGNOSIS — M1A09X0 Idiopathic chronic gout, multiple sites, without tophus (tophi): Secondary | ICD-10-CM | POA: Diagnosis not present

## 2019-12-15 DIAGNOSIS — M25521 Pain in right elbow: Secondary | ICD-10-CM | POA: Diagnosis not present

## 2019-12-15 DIAGNOSIS — M450 Ankylosing spondylitis of multiple sites in spine: Secondary | ICD-10-CM | POA: Diagnosis not present

## 2019-12-15 DIAGNOSIS — M150 Primary generalized (osteo)arthritis: Secondary | ICD-10-CM | POA: Diagnosis not present

## 2019-12-21 IMAGING — CR DG FOOT 2V*L*
2 series · 2 of 2 positions shown · non-contrast
Comparison: None.

CLINICAL DATA: Chronic nonhealing wound between the fourth and
fifth metatarsals.

EXAM:
LEFT FOOT - 2 VIEW

[x foot ap left]
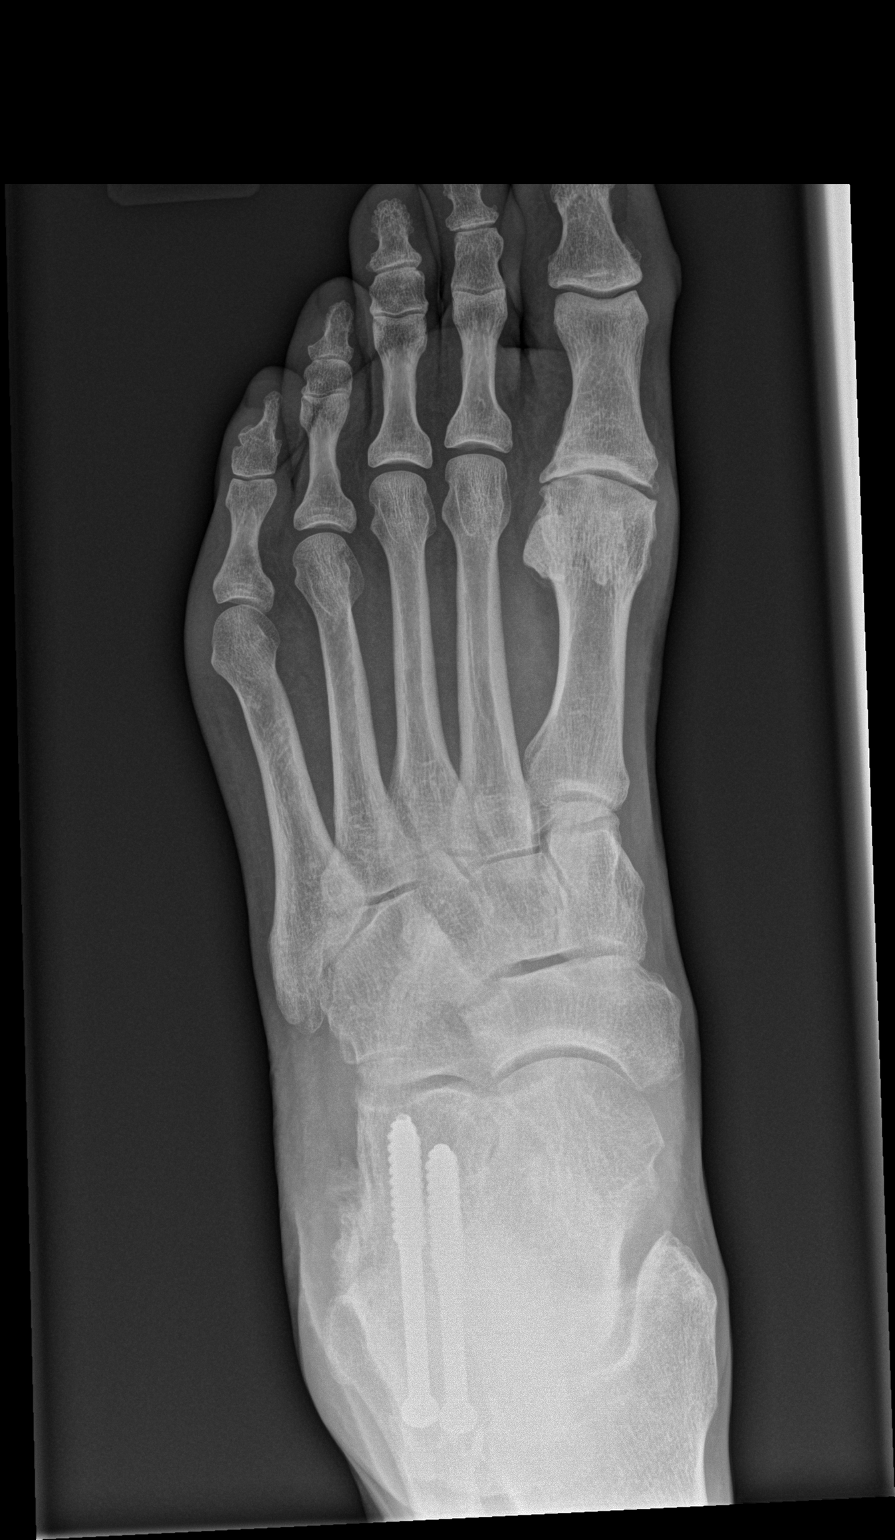

[x foot lat left]
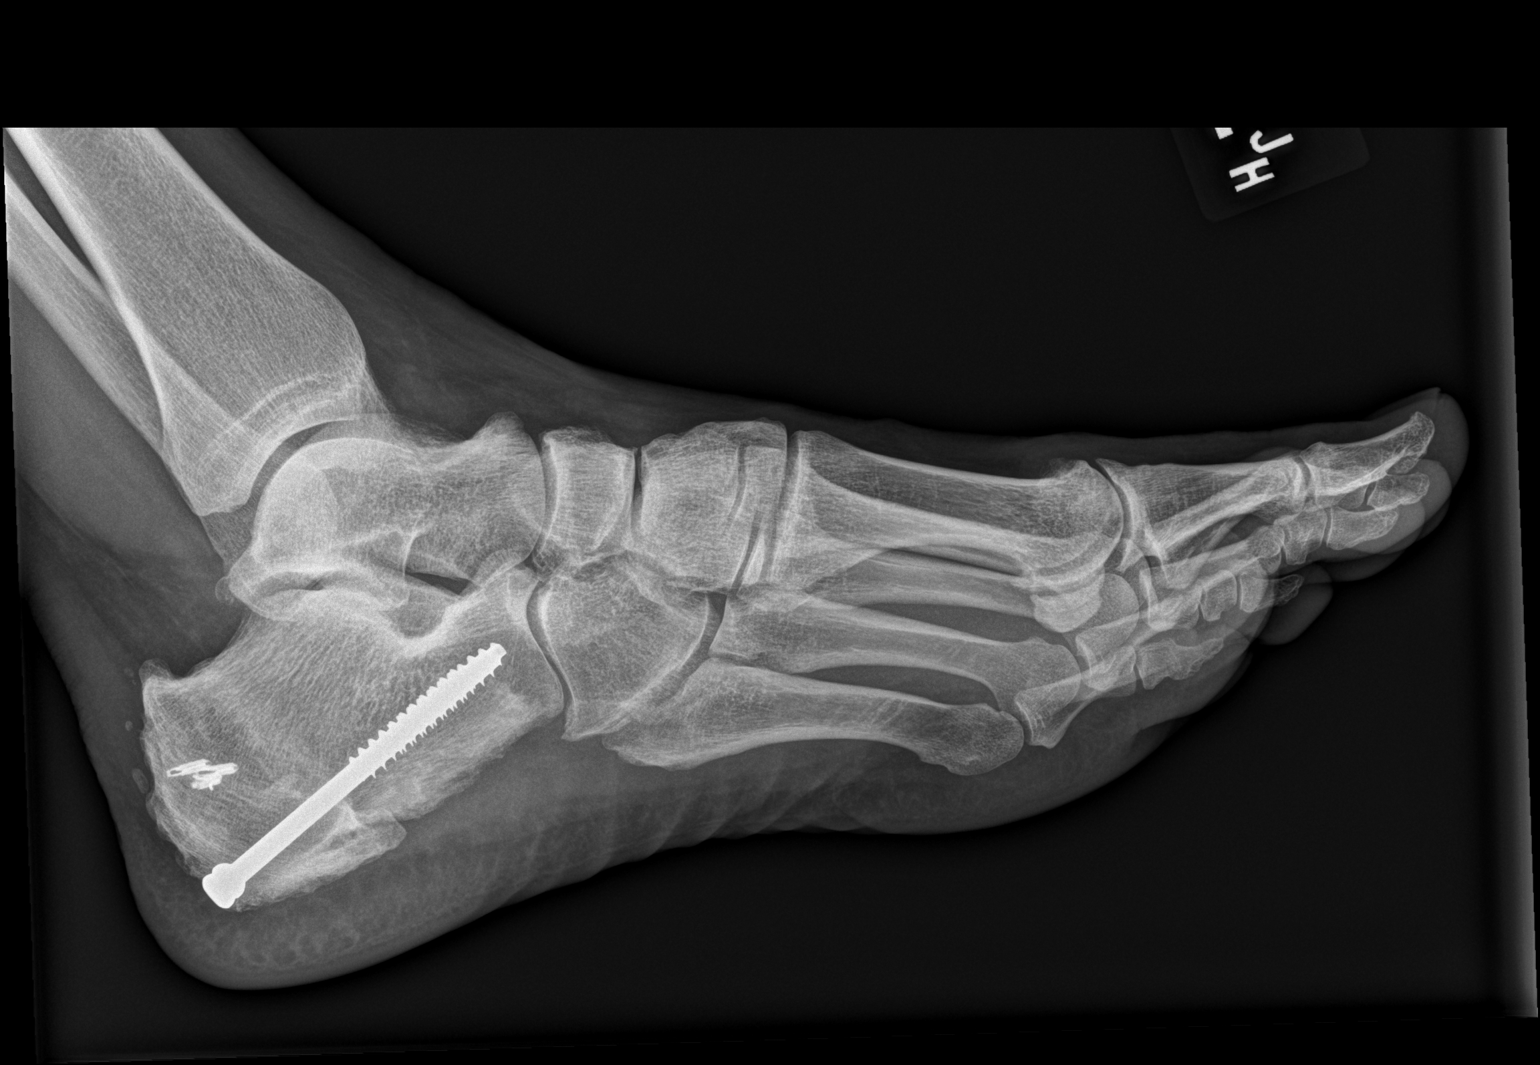

[2 of 2 positions shown; findings below may reference images not displayed]

FINDINGS: No erosion, soft tissue gas, or opaque foreign body.

Remote calcaneal fracture with repair.

Advanced first MTP osteoarthritis.
IMPRESSION: No acute finding.

## 2019-12-23 DIAGNOSIS — M450 Ankylosing spondylitis of multiple sites in spine: Secondary | ICD-10-CM | POA: Diagnosis not present

## 2020-02-24 DIAGNOSIS — Z79899 Other long term (current) drug therapy: Secondary | ICD-10-CM | POA: Diagnosis not present

## 2020-02-24 DIAGNOSIS — M450 Ankylosing spondylitis of multiple sites in spine: Secondary | ICD-10-CM | POA: Diagnosis not present

## 2020-03-05 DIAGNOSIS — M459 Ankylosing spondylitis of unspecified sites in spine: Secondary | ICD-10-CM | POA: Diagnosis not present

## 2020-03-05 DIAGNOSIS — I10 Essential (primary) hypertension: Secondary | ICD-10-CM | POA: Diagnosis not present

## 2020-03-05 DIAGNOSIS — J0101 Acute recurrent maxillary sinusitis: Secondary | ICD-10-CM | POA: Diagnosis not present

## 2020-03-05 DIAGNOSIS — F5101 Primary insomnia: Secondary | ICD-10-CM | POA: Diagnosis not present

## 2020-04-20 DIAGNOSIS — Z79899 Other long term (current) drug therapy: Secondary | ICD-10-CM | POA: Diagnosis not present

## 2020-04-20 DIAGNOSIS — M450 Ankylosing spondylitis of multiple sites in spine: Secondary | ICD-10-CM | POA: Diagnosis not present

## 2020-06-14 DIAGNOSIS — M450 Ankylosing spondylitis of multiple sites in spine: Secondary | ICD-10-CM | POA: Diagnosis not present

## 2020-06-14 DIAGNOSIS — M25521 Pain in right elbow: Secondary | ICD-10-CM | POA: Diagnosis not present

## 2020-06-14 DIAGNOSIS — M150 Primary generalized (osteo)arthritis: Secondary | ICD-10-CM | POA: Diagnosis not present

## 2020-06-14 DIAGNOSIS — M1A09X0 Idiopathic chronic gout, multiple sites, without tophus (tophi): Secondary | ICD-10-CM | POA: Diagnosis not present

## 2020-06-22 DIAGNOSIS — M450 Ankylosing spondylitis of multiple sites in spine: Secondary | ICD-10-CM | POA: Diagnosis not present

## 2020-08-17 DIAGNOSIS — M450 Ankylosing spondylitis of multiple sites in spine: Secondary | ICD-10-CM | POA: Diagnosis not present

## 2020-09-14 DIAGNOSIS — L409 Psoriasis, unspecified: Secondary | ICD-10-CM | POA: Diagnosis not present

## 2020-09-14 DIAGNOSIS — M459 Ankylosing spondylitis of unspecified sites in spine: Secondary | ICD-10-CM | POA: Diagnosis not present

## 2020-09-14 DIAGNOSIS — M1A09X0 Idiopathic chronic gout, multiple sites, without tophus (tophi): Secondary | ICD-10-CM | POA: Diagnosis not present

## 2020-09-14 DIAGNOSIS — M450 Ankylosing spondylitis of multiple sites in spine: Secondary | ICD-10-CM | POA: Diagnosis not present

## 2020-09-14 DIAGNOSIS — F5101 Primary insomnia: Secondary | ICD-10-CM | POA: Diagnosis not present

## 2020-09-14 DIAGNOSIS — I10 Essential (primary) hypertension: Secondary | ICD-10-CM | POA: Diagnosis not present

## 2020-09-14 DIAGNOSIS — M150 Primary generalized (osteo)arthritis: Secondary | ICD-10-CM | POA: Diagnosis not present

## 2020-09-14 DIAGNOSIS — M25561 Pain in right knee: Secondary | ICD-10-CM | POA: Diagnosis not present

## 2020-10-12 DIAGNOSIS — Z79899 Other long term (current) drug therapy: Secondary | ICD-10-CM | POA: Diagnosis not present

## 2020-10-12 DIAGNOSIS — M450 Ankylosing spondylitis of multiple sites in spine: Secondary | ICD-10-CM | POA: Diagnosis not present

## 2021-03-10 DIAGNOSIS — Z125 Encounter for screening for malignant neoplasm of prostate: Secondary | ICD-10-CM | POA: Diagnosis not present

## 2021-03-10 DIAGNOSIS — I10 Essential (primary) hypertension: Secondary | ICD-10-CM | POA: Diagnosis not present

## 2021-03-10 DIAGNOSIS — F5101 Primary insomnia: Secondary | ICD-10-CM | POA: Diagnosis not present

## 2021-03-10 DIAGNOSIS — G4700 Insomnia, unspecified: Secondary | ICD-10-CM | POA: Diagnosis not present

## 2021-03-10 DIAGNOSIS — M459 Ankylosing spondylitis of unspecified sites in spine: Secondary | ICD-10-CM | POA: Diagnosis not present

## 2021-04-05 DIAGNOSIS — M450 Ankylosing spondylitis of multiple sites in spine: Secondary | ICD-10-CM | POA: Diagnosis not present

## 2021-04-05 DIAGNOSIS — Z111 Encounter for screening for respiratory tuberculosis: Secondary | ICD-10-CM | POA: Diagnosis not present

## 2021-04-05 DIAGNOSIS — M150 Primary generalized (osteo)arthritis: Secondary | ICD-10-CM | POA: Diagnosis not present

## 2021-04-05 DIAGNOSIS — M1A09X0 Idiopathic chronic gout, multiple sites, without tophus (tophi): Secondary | ICD-10-CM | POA: Diagnosis not present

## 2021-04-05 DIAGNOSIS — M25521 Pain in right elbow: Secondary | ICD-10-CM | POA: Diagnosis not present

## 2021-04-05 DIAGNOSIS — M25561 Pain in right knee: Secondary | ICD-10-CM | POA: Diagnosis not present

## 2021-04-21 DIAGNOSIS — R972 Elevated prostate specific antigen [PSA]: Secondary | ICD-10-CM | POA: Diagnosis not present

## 2021-04-28 DIAGNOSIS — M450 Ankylosing spondylitis of multiple sites in spine: Secondary | ICD-10-CM | POA: Diagnosis not present

## 2021-05-26 DIAGNOSIS — M450 Ankylosing spondylitis of multiple sites in spine: Secondary | ICD-10-CM | POA: Diagnosis not present

## 2021-06-06 DIAGNOSIS — J324 Chronic pansinusitis: Secondary | ICD-10-CM | POA: Diagnosis not present

## 2021-06-06 DIAGNOSIS — J342 Deviated nasal septum: Secondary | ICD-10-CM | POA: Diagnosis not present

## 2021-06-06 DIAGNOSIS — J310 Chronic rhinitis: Secondary | ICD-10-CM | POA: Diagnosis not present

## 2021-06-06 DIAGNOSIS — J343 Hypertrophy of nasal turbinates: Secondary | ICD-10-CM | POA: Diagnosis not present

## 2021-06-29 ENCOUNTER — Other Ambulatory Visit: Payer: Self-pay | Admitting: Otolaryngology

## 2021-06-29 DIAGNOSIS — J329 Chronic sinusitis, unspecified: Secondary | ICD-10-CM

## 2021-07-19 DIAGNOSIS — M1A09X0 Idiopathic chronic gout, multiple sites, without tophus (tophi): Secondary | ICD-10-CM | POA: Diagnosis not present

## 2021-07-19 DIAGNOSIS — M25521 Pain in right elbow: Secondary | ICD-10-CM | POA: Diagnosis not present

## 2021-07-19 DIAGNOSIS — M25561 Pain in right knee: Secondary | ICD-10-CM | POA: Diagnosis not present

## 2021-07-19 DIAGNOSIS — M450 Ankylosing spondylitis of multiple sites in spine: Secondary | ICD-10-CM | POA: Diagnosis not present

## 2021-07-22 DIAGNOSIS — M450 Ankylosing spondylitis of multiple sites in spine: Secondary | ICD-10-CM | POA: Diagnosis not present

## 2021-07-29 ENCOUNTER — Ambulatory Visit
Admission: RE | Admit: 2021-07-29 | Discharge: 2021-07-29 | Disposition: A | Discharge status: Home / Self Care | Payer: BC Managed Care – PPO | Source: Ambulatory Visit | Attending: Otolaryngology | Admitting: Otolaryngology

## 2021-07-29 DIAGNOSIS — J342 Deviated nasal septum: Secondary | ICD-10-CM | POA: Diagnosis not present

## 2021-07-29 DIAGNOSIS — J3489 Other specified disorders of nose and nasal sinuses: Secondary | ICD-10-CM | POA: Diagnosis not present

## 2021-07-29 DIAGNOSIS — J329 Chronic sinusitis, unspecified: Secondary | ICD-10-CM

## 2021-08-03 DIAGNOSIS — J310 Chronic rhinitis: Secondary | ICD-10-CM | POA: Diagnosis not present

## 2021-08-03 DIAGNOSIS — J343 Hypertrophy of nasal turbinates: Secondary | ICD-10-CM | POA: Diagnosis not present

## 2021-08-03 DIAGNOSIS — J342 Deviated nasal septum: Secondary | ICD-10-CM | POA: Diagnosis not present

## 2021-09-08 DIAGNOSIS — M459 Ankylosing spondylitis of unspecified sites in spine: Secondary | ICD-10-CM | POA: Diagnosis not present

## 2021-09-08 DIAGNOSIS — L409 Psoriasis, unspecified: Secondary | ICD-10-CM | POA: Diagnosis not present

## 2021-09-08 DIAGNOSIS — M1A09X0 Idiopathic chronic gout, multiple sites, without tophus (tophi): Secondary | ICD-10-CM | POA: Diagnosis not present

## 2021-09-08 DIAGNOSIS — I10 Essential (primary) hypertension: Secondary | ICD-10-CM | POA: Diagnosis not present

## 2021-09-19 DIAGNOSIS — M450 Ankylosing spondylitis of multiple sites in spine: Secondary | ICD-10-CM | POA: Diagnosis not present

## 2021-09-19 DIAGNOSIS — Z79899 Other long term (current) drug therapy: Secondary | ICD-10-CM | POA: Diagnosis not present

## 2021-10-27 DIAGNOSIS — J3489 Other specified disorders of nose and nasal sinuses: Secondary | ICD-10-CM | POA: Diagnosis not present

## 2021-10-27 DIAGNOSIS — J342 Deviated nasal septum: Secondary | ICD-10-CM | POA: Diagnosis not present

## 2021-10-27 DIAGNOSIS — J343 Hypertrophy of nasal turbinates: Secondary | ICD-10-CM | POA: Diagnosis not present

## 2021-11-08 DIAGNOSIS — M25561 Pain in right knee: Secondary | ICD-10-CM | POA: Diagnosis not present

## 2021-11-08 DIAGNOSIS — M450 Ankylosing spondylitis of multiple sites in spine: Secondary | ICD-10-CM | POA: Diagnosis not present

## 2021-11-08 DIAGNOSIS — M1A09X0 Idiopathic chronic gout, multiple sites, without tophus (tophi): Secondary | ICD-10-CM | POA: Diagnosis not present

## 2021-11-08 DIAGNOSIS — M25521 Pain in right elbow: Secondary | ICD-10-CM | POA: Diagnosis not present

## 2021-11-17 DIAGNOSIS — M450 Ankylosing spondylitis of multiple sites in spine: Secondary | ICD-10-CM | POA: Diagnosis not present

## 2021-12-22 DIAGNOSIS — M1A09X0 Idiopathic chronic gout, multiple sites, without tophus (tophi): Secondary | ICD-10-CM | POA: Diagnosis not present

## 2021-12-22 DIAGNOSIS — R252 Cramp and spasm: Secondary | ICD-10-CM | POA: Diagnosis not present

## 2021-12-22 DIAGNOSIS — I10 Essential (primary) hypertension: Secondary | ICD-10-CM | POA: Diagnosis not present

## 2022-01-12 DIAGNOSIS — M450 Ankylosing spondylitis of multiple sites in spine: Secondary | ICD-10-CM | POA: Diagnosis not present

## 2022-02-23 DIAGNOSIS — J0100 Acute maxillary sinusitis, unspecified: Secondary | ICD-10-CM | POA: Diagnosis not present

## 2022-03-09 DIAGNOSIS — Z111 Encounter for screening for respiratory tuberculosis: Secondary | ICD-10-CM | POA: Diagnosis not present

## 2022-03-09 DIAGNOSIS — M450 Ankylosing spondylitis of multiple sites in spine: Secondary | ICD-10-CM | POA: Diagnosis not present

## 2022-03-09 DIAGNOSIS — R5383 Other fatigue: Secondary | ICD-10-CM | POA: Diagnosis not present

## 2022-03-09 DIAGNOSIS — Z79899 Other long term (current) drug therapy: Secondary | ICD-10-CM | POA: Diagnosis not present

## 2022-04-06 DIAGNOSIS — I10 Essential (primary) hypertension: Secondary | ICD-10-CM | POA: Diagnosis not present

## 2022-04-06 DIAGNOSIS — L409 Psoriasis, unspecified: Secondary | ICD-10-CM | POA: Diagnosis not present

## 2022-04-06 DIAGNOSIS — Z125 Encounter for screening for malignant neoplasm of prostate: Secondary | ICD-10-CM | POA: Diagnosis not present

## 2022-04-06 DIAGNOSIS — M1A09X0 Idiopathic chronic gout, multiple sites, without tophus (tophi): Secondary | ICD-10-CM | POA: Diagnosis not present

## 2022-04-06 DIAGNOSIS — F5101 Primary insomnia: Secondary | ICD-10-CM | POA: Diagnosis not present

## 2022-05-09 DIAGNOSIS — M25561 Pain in right knee: Secondary | ICD-10-CM | POA: Diagnosis not present

## 2022-05-09 DIAGNOSIS — M1A09X0 Idiopathic chronic gout, multiple sites, without tophus (tophi): Secondary | ICD-10-CM | POA: Diagnosis not present

## 2022-05-09 DIAGNOSIS — M25521 Pain in right elbow: Secondary | ICD-10-CM | POA: Diagnosis not present

## 2022-05-09 DIAGNOSIS — M450 Ankylosing spondylitis of multiple sites in spine: Secondary | ICD-10-CM | POA: Diagnosis not present

## 2022-05-16 DIAGNOSIS — M450 Ankylosing spondylitis of multiple sites in spine: Secondary | ICD-10-CM | POA: Diagnosis not present

## 2022-05-30 DIAGNOSIS — J310 Chronic rhinitis: Secondary | ICD-10-CM | POA: Diagnosis not present

## 2022-05-30 DIAGNOSIS — J343 Hypertrophy of nasal turbinates: Secondary | ICD-10-CM | POA: Diagnosis not present

## 2022-07-11 DIAGNOSIS — M450 Ankylosing spondylitis of multiple sites in spine: Secondary | ICD-10-CM | POA: Diagnosis not present

## 2022-07-21 DIAGNOSIS — J019 Acute sinusitis, unspecified: Secondary | ICD-10-CM | POA: Diagnosis not present

## 2022-09-05 DIAGNOSIS — Z79899 Other long term (current) drug therapy: Secondary | ICD-10-CM | POA: Diagnosis not present

## 2022-09-05 DIAGNOSIS — R5383 Other fatigue: Secondary | ICD-10-CM | POA: Diagnosis not present

## 2022-09-05 DIAGNOSIS — M450 Ankylosing spondylitis of multiple sites in spine: Secondary | ICD-10-CM | POA: Diagnosis not present

## 2022-10-12 DIAGNOSIS — M1A09X0 Idiopathic chronic gout, multiple sites, without tophus (tophi): Secondary | ICD-10-CM | POA: Diagnosis not present

## 2022-10-12 DIAGNOSIS — I10 Essential (primary) hypertension: Secondary | ICD-10-CM | POA: Diagnosis not present

## 2022-10-12 DIAGNOSIS — L409 Psoriasis, unspecified: Secondary | ICD-10-CM | POA: Diagnosis not present

## 2022-10-12 DIAGNOSIS — M459 Ankylosing spondylitis of unspecified sites in spine: Secondary | ICD-10-CM | POA: Diagnosis not present

## 2022-10-31 DIAGNOSIS — M450 Ankylosing spondylitis of multiple sites in spine: Secondary | ICD-10-CM | POA: Diagnosis not present

## 2022-11-07 DIAGNOSIS — M25521 Pain in right elbow: Secondary | ICD-10-CM | POA: Diagnosis not present

## 2022-11-07 DIAGNOSIS — M1A09X0 Idiopathic chronic gout, multiple sites, without tophus (tophi): Secondary | ICD-10-CM | POA: Diagnosis not present

## 2022-11-07 DIAGNOSIS — M450 Ankylosing spondylitis of multiple sites in spine: Secondary | ICD-10-CM | POA: Diagnosis not present

## 2022-11-07 DIAGNOSIS — M25561 Pain in right knee: Secondary | ICD-10-CM | POA: Diagnosis not present

## 2022-12-26 DIAGNOSIS — M450 Ankylosing spondylitis of multiple sites in spine: Secondary | ICD-10-CM | POA: Diagnosis not present

## 2023-02-26 DIAGNOSIS — M450 Ankylosing spondylitis of multiple sites in spine: Secondary | ICD-10-CM | POA: Diagnosis not present

## 2023-04-10 ENCOUNTER — Other Ambulatory Visit: Payer: Self-pay

## 2023-04-10 ENCOUNTER — Encounter (HOSPITAL_BASED_OUTPATIENT_CLINIC_OR_DEPARTMENT_OTHER)
Admission: RE | Admit: 2023-04-10 | Discharge: 2023-04-10 | Disposition: A | Discharge status: Home / Self Care | Payer: BC Managed Care – PPO | Source: Ambulatory Visit | Attending: Orthopedic Surgery | Admitting: Orthopedic Surgery

## 2023-04-10 ENCOUNTER — Other Ambulatory Visit: Payer: Self-pay | Admitting: Orthopedic Surgery

## 2023-04-10 ENCOUNTER — Encounter (HOSPITAL_BASED_OUTPATIENT_CLINIC_OR_DEPARTMENT_OTHER): Payer: Self-pay | Admitting: Orthopedic Surgery

## 2023-04-10 DIAGNOSIS — S61215A Laceration without foreign body of left ring finger without damage to nail, initial encounter: Secondary | ICD-10-CM | POA: Diagnosis not present

## 2023-04-10 DIAGNOSIS — I10 Essential (primary) hypertension: Secondary | ICD-10-CM | POA: Diagnosis not present

## 2023-04-10 DIAGNOSIS — Z01810 Encounter for preprocedural cardiovascular examination: Secondary | ICD-10-CM | POA: Insufficient documentation

## 2023-04-10 DIAGNOSIS — X58XXXA Exposure to other specified factors, initial encounter: Secondary | ICD-10-CM | POA: Diagnosis not present

## 2023-04-10 DIAGNOSIS — S66325A Laceration of extensor muscle, fascia and tendon of left ring finger at wrist and hand level, initial encounter: Secondary | ICD-10-CM | POA: Diagnosis not present

## 2023-04-12 ENCOUNTER — Encounter (HOSPITAL_BASED_OUTPATIENT_CLINIC_OR_DEPARTMENT_OTHER): Payer: Self-pay | Admitting: Orthopedic Surgery

## 2023-04-12 ENCOUNTER — Other Ambulatory Visit: Payer: Self-pay

## 2023-04-12 ENCOUNTER — Ambulatory Visit (HOSPITAL_BASED_OUTPATIENT_CLINIC_OR_DEPARTMENT_OTHER): Payer: BC Managed Care – PPO | Admitting: Anesthesiology

## 2023-04-12 ENCOUNTER — Encounter (HOSPITAL_BASED_OUTPATIENT_CLINIC_OR_DEPARTMENT_OTHER)
Admission: RE | Disposition: A | Discharge status: Home / Self Care | Payer: Self-pay | Source: Ambulatory Visit | Attending: Orthopedic Surgery

## 2023-04-12 ENCOUNTER — Ambulatory Visit (HOSPITAL_BASED_OUTPATIENT_CLINIC_OR_DEPARTMENT_OTHER)
Admission: RE | Admit: 2023-04-12 | Discharge: 2023-04-12 | Disposition: A | Discharge status: Home / Self Care | Payer: BC Managed Care – PPO | Source: Ambulatory Visit | Attending: Orthopedic Surgery | Admitting: Orthopedic Surgery

## 2023-04-12 ENCOUNTER — Ambulatory Visit (HOSPITAL_BASED_OUTPATIENT_CLINIC_OR_DEPARTMENT_OTHER): Payer: BC Managed Care – PPO

## 2023-04-12 DIAGNOSIS — S66325A Laceration of extensor muscle, fascia and tendon of left ring finger at wrist and hand level, initial encounter: Secondary | ICD-10-CM | POA: Diagnosis not present

## 2023-04-12 DIAGNOSIS — S61215A Laceration without foreign body of left ring finger without damage to nail, initial encounter: Secondary | ICD-10-CM | POA: Diagnosis not present

## 2023-04-12 DIAGNOSIS — X58XXXA Exposure to other specified factors, initial encounter: Secondary | ICD-10-CM | POA: Insufficient documentation

## 2023-04-12 DIAGNOSIS — I10 Essential (primary) hypertension: Secondary | ICD-10-CM | POA: Insufficient documentation

## 2023-04-12 DIAGNOSIS — M20022 Boutonniere deformity of left finger(s): Secondary | ICD-10-CM | POA: Diagnosis not present

## 2023-04-12 HISTORY — PX: REPAIR EXTENSOR TENDON: SHX5382

## 2023-04-12 HISTORY — DX: Gout, unspecified: M10.9

## 2023-04-12 HISTORY — PX: PERCUTANEOUS PINNING: SHX2209

## 2023-04-12 HISTORY — DX: Unspecified osteoarthritis, unspecified site: M19.90

## 2023-04-12 HISTORY — DX: Essential (primary) hypertension: I10

## 2023-04-12 SURGERY — REPAIR, TENDON, EXTENSOR
Anesthesia: Monitor Anesthesia Care | Site: Hand | Laterality: Left

## 2023-04-12 MED ORDER — FENTANYL CITRATE (PF) 100 MCG/2ML IJ SOLN
INTRAMUSCULAR | Status: DC | PRN
  Administered 2023-04-12 (×2): 25 ug via INTRAVENOUS

## 2023-04-12 MED ORDER — PROPOFOL 10 MG/ML IV BOLUS
INTRAVENOUS | Status: DC | PRN
  Administered 2023-04-12: 175 ug/kg/min via INTRAVENOUS
  Administered 2023-04-12 (×2): 50 mg via INTRAVENOUS

## 2023-04-12 MED ORDER — EPHEDRINE 5 MG/ML INJ
INTRAVENOUS | Status: AC
Start: 2023-04-12 — End: ?
  Filled 2023-04-12: qty 5

## 2023-04-12 MED ORDER — ONDANSETRON HCL 4 MG/2ML IJ SOLN: INTRAMUSCULAR | Status: DC | PRN

## 2023-04-12 MED ORDER — SODIUM CHLORIDE 0.9 % IV SOLN: INTRAVENOUS | Status: DC | PRN

## 2023-04-12 MED ORDER — ROPIVACAINE HCL 5 MG/ML IJ SOLN
INTRAMUSCULAR | Status: DC | PRN
  Administered 2023-04-12: 30 mL via PERINEURAL

## 2023-04-12 MED ORDER — DEXAMETHASONE SODIUM PHOSPHATE 10 MG/ML IJ SOLN
INTRAMUSCULAR | Status: AC
  Filled 2023-04-12: qty 1

## 2023-04-12 MED ORDER — FENTANYL CITRATE (PF) 100 MCG/2ML IJ SOLN
100.0000 ug | Freq: Once | INTRAMUSCULAR | Status: AC
  Administered 2023-04-12: 100 ug via INTRAVENOUS

## 2023-04-12 MED ORDER — LIDOCAINE 2% (20 MG/ML) 5 ML SYRINGE
INTRAMUSCULAR | Status: AC
Start: 2023-04-12 — End: ?
  Filled 2023-04-12: qty 5

## 2023-04-12 MED ORDER — CEFAZOLIN SODIUM-DEXTROSE 2-4 GM/100ML-% IV SOLN
2.0000 g | INTRAVENOUS | Status: AC
  Administered 2023-04-12: 2 g via INTRAVENOUS

## 2023-04-12 MED ORDER — MIDAZOLAM HCL 2 MG/2ML IJ SOLN
INTRAMUSCULAR | Status: AC
  Filled 2023-04-12: qty 2

## 2023-04-12 MED ORDER — MIDAZOLAM HCL 2 MG/2ML IJ SOLN
2.0000 mg | Freq: Once | INTRAMUSCULAR | Status: AC
  Administered 2023-04-12: 2 mg via INTRAVENOUS

## 2023-04-12 MED ORDER — ONDANSETRON HCL 4 MG/2ML IJ SOLN
INTRAMUSCULAR | Status: AC
Start: 2023-04-12 — End: ?
  Filled 2023-04-12: qty 2

## 2023-04-12 MED ORDER — HYDROCODONE-ACETAMINOPHEN 5-325 MG PO TABS: 1.0000 | ORAL_TABLET | Freq: Four times a day (QID) | ORAL | 0 refills | Status: AC | PRN

## 2023-04-12 MED ORDER — LIDOCAINE 2% (20 MG/ML) 5 ML SYRINGE
INTRAMUSCULAR | Status: DC | PRN
  Administered 2023-04-12: 60 mg via INTRAVENOUS

## 2023-04-12 MED ORDER — CEFAZOLIN SODIUM-DEXTROSE 2-4 GM/100ML-% IV SOLN
INTRAVENOUS | Status: AC
  Filled 2023-04-12: qty 100

## 2023-04-12 MED ORDER — LACTATED RINGERS IV SOLN: INTRAVENOUS | Status: DC

## 2023-04-12 MED ORDER — DEXMEDETOMIDINE HCL IN NACL 80 MCG/20ML IV SOLN
INTRAVENOUS | Status: DC | PRN
  Administered 2023-04-12: 8 ug via INTRAVENOUS

## 2023-04-12 MED ORDER — FENTANYL CITRATE (PF) 100 MCG/2ML IJ SOLN
INTRAMUSCULAR | Status: AC
  Filled 2023-04-12: qty 2

## 2023-04-12 MED ORDER — DEXMEDETOMIDINE HCL IN NACL 80 MCG/20ML IV SOLN
INTRAVENOUS | Status: AC
Start: 2023-04-12 — End: ?
  Filled 2023-04-12: qty 20

## 2023-04-12 SURGICAL SUPPLY — 79 items
BAG DECANTER FOR FLEXI CONT (MISCELLANEOUS) IMPLANT
BENZOIN TINCTURE PRP APPL 2/3 (GAUZE/BANDAGES/DRESSINGS) IMPLANT
BLADE MINI RND TIP GREEN BEAV (BLADE) IMPLANT
BLADE SURG 15 STRL LF DISP TIS (BLADE) ×2 IMPLANT
BNDG COHESIVE 1X5 TAN STRL LF (GAUZE/BANDAGES/DRESSINGS) IMPLANT
BNDG ELASTIC 2INX 5YD STR LF (GAUZE/BANDAGES/DRESSINGS) IMPLANT
BNDG ELASTIC 3INX 5YD STR LF (GAUZE/BANDAGES/DRESSINGS) ×1 IMPLANT
BNDG ELASTIC 4INX 5YD STR LF (GAUZE/BANDAGES/DRESSINGS) IMPLANT
BNDG ESMARK 4X9 LF (GAUZE/BANDAGES/DRESSINGS) ×1 IMPLANT
BNDG GAUZE DERMACEA FLUFF 4 (GAUZE/BANDAGES/DRESSINGS) ×1 IMPLANT
CHLORAPREP W/TINT 26 (MISCELLANEOUS) ×1 IMPLANT
CORD BIPOLAR FORCEPS 12FT (ELECTRODE) ×1 IMPLANT
COTTONBALL LRG STERILE PKG (GAUZE/BANDAGES/DRESSINGS) IMPLANT
COVER BACK TABLE 60X90IN (DRAPES) ×1 IMPLANT
COVER MAYO STAND STRL (DRAPES) ×1 IMPLANT
CUFF TOURN SGL QUICK 18X4 (TOURNIQUET CUFF) ×1 IMPLANT
DRAPE EXTREMITY T 121X128X90 (DISPOSABLE) ×1 IMPLANT
DRAPE OEC MINIVIEW 54X84 (DRAPES) ×1 IMPLANT
DRAPE SURG 17X23 STRL (DRAPES) ×1 IMPLANT
GAUZE 4X4 16PLY ~~LOC~~+RFID DBL (SPONGE) IMPLANT
GAUZE PAD ABD 8X10 STRL (GAUZE/BANDAGES/DRESSINGS) IMPLANT
GAUZE SPONGE 4X4 12PLY STRL (GAUZE/BANDAGES/DRESSINGS) ×1 IMPLANT
GAUZE STRETCH 2X75IN STRL (MISCELLANEOUS) IMPLANT
GAUZE XEROFORM 1X8 LF (GAUZE/BANDAGES/DRESSINGS) ×1 IMPLANT
GLOVE BIO SURGEON STRL SZ7.5 (GLOVE) ×1 IMPLANT
GLOVE BIOGEL PI IND STRL 8 (GLOVE) ×1 IMPLANT
GOWN STRL REUS W/ TWL LRG LVL3 (GOWN DISPOSABLE) ×1 IMPLANT
GOWN STRL REUS W/ TWL XL LVL3 (GOWN DISPOSABLE) ×1 IMPLANT
GOWN STRL REUS W/TWL XL LVL3 (GOWN DISPOSABLE) ×1 IMPLANT
K-WIRE DBL .035X4 NSTRL (WIRE) ×1 IMPLANT
KWIRE DBL .035X4 NSTRL (WIRE) IMPLANT
LOOP VASCLR MAXI BLUE 18IN ST (MISCELLANEOUS) IMPLANT
NDL HYPO 22X1.5 SAFETY MO (MISCELLANEOUS) IMPLANT
NDL HYPO 25X1 1.5 SAFETY (NEEDLE) IMPLANT
NDL KEITH (NEEDLE) IMPLANT
NEEDLE HYPO 22X1.5 SAFETY MO (MISCELLANEOUS) IMPLANT
NEEDLE HYPO 25X1 1.5 SAFETY (NEEDLE) IMPLANT
NEEDLE KEITH (NEEDLE) IMPLANT
NS IRRIG 1000ML POUR BTL (IV SOLUTION) ×1 IMPLANT
PACK BASIN DAY SURGERY FS (CUSTOM PROCEDURE TRAY) ×1 IMPLANT
PAD CAST 3X4 CTTN HI CHSV (CAST SUPPLIES) ×1 IMPLANT
PAD CAST 4YDX4 CTTN HI CHSV (CAST SUPPLIES) ×1 IMPLANT
PADDING CAST ABS COTTON 3X4 (CAST SUPPLIES) IMPLANT
PADDING CAST ABS COTTON 4X4 ST (CAST SUPPLIES) ×1 IMPLANT
SLEEVE SCD COMPRESS KNEE MED (STOCKING) IMPLANT
SLING ARM FOAM STRAP XLG (SOFTGOODS) IMPLANT
SPIKE FLUID TRANSFER (MISCELLANEOUS) IMPLANT
SPLINT PLASTER CAST XFAST 3X15 (CAST SUPPLIES) IMPLANT
SPLINT PLASTER CAST XFAST 4X15 (CAST SUPPLIES) IMPLANT
STOCKINETTE 4X48 STRL (DRAPES) ×1 IMPLANT
STRIP CLOSURE SKIN 1/2X4 (GAUZE/BANDAGES/DRESSINGS) IMPLANT
SUT CHROMIC 5 0 P 3 (SUTURE) IMPLANT
SUT ETHIBOND 3-0 V-5 (SUTURE) IMPLANT
SUT ETHILON 3 0 PS 1 (SUTURE) IMPLANT
SUT ETHILON 4 0 PS 2 18 (SUTURE) ×1 IMPLANT
SUT ETHILON 6 0 P 1 (SUTURE) IMPLANT
SUT FIBERWIRE 3-0 18 TAPR NDL (SUTURE) IMPLANT
SUT FIBERWIRE 4-0 18 DIAM BLUE (SUTURE) IMPLANT
SUT MERSILENE 2.0 SH NDLE (SUTURE) IMPLANT
SUT MERSILENE 4 0 P 3 (SUTURE) IMPLANT
SUT MNCRL AB 4-0 PS2 18 (SUTURE) IMPLANT
SUT NYLON ETHILON 5-0 P-3 1X18 (SUTURE) IMPLANT
SUT PROLENE 2 0 SH DA (SUTURE) IMPLANT
SUT PROLENE 6 0 P 1 18 (SUTURE) IMPLANT
SUT SILK 2 0 PERMA HAND 18 BK (SUTURE) IMPLANT
SUT SILK 4 0 PS 2 (SUTURE) IMPLANT
SUT VIC AB 3-0 FS2 27 (SUTURE) IMPLANT
SUT VIC AB 3-0 PS1 18XBRD (SUTURE) IMPLANT
SUT VIC AB 4-0 P-3 18XBRD (SUTURE) IMPLANT
SUT VIC AB 4-0 PS2 18 (SUTURE) IMPLANT
SUTURE FIBERWR 3-0 18 TAPR NDL (SUTURE) IMPLANT
SUTURE FIBERWR 4-0 18 DIA BLUE (SUTURE) IMPLANT
SYR BULB EAR ULCER 3OZ GRN STR (SYRINGE) ×1 IMPLANT
SYR CONTROL 10ML LL (SYRINGE) IMPLANT
TIE VASCULAR MAXI BLUE 18IN ST (MISCELLANEOUS) IMPLANT
TOWEL GREEN STERILE FF (TOWEL DISPOSABLE) ×2 IMPLANT
TUBE NG 5FR 35IN ENFIT (TUBING) IMPLANT
UNDERPAD 30X36 HEAVY ABSORB (UNDERPADS AND DIAPERS) ×1 IMPLANT
VASCULAR TIE MAXI BLUE 18IN ST (MISCELLANEOUS)

## 2023-04-12 NOTE — Op Note (Signed)
I assisted Surgeons and Role:    * Betha Loa, MD - Primary    * Cindee Salt, MD - Assisting on the Procedure(s): LEFT RING FINGER REPAIR EXTENSOR TENDON PINNING PROXIMAL INTERPHALANGEAL JOINT on 04/12/2023.  I provided assistance on this case as follows: Set up, approach, application of the laceration, stabilization for pinning of the joint, debridement of the tendon, repair of the tendon, closure of the wound and application of the dressing and splint.  Electronically signed by: Cindee Salt, MD Date: 04/12/2023 Time: 1:17 PM

## 2023-04-12 NOTE — Anesthesia Postprocedure Evaluation (Signed)
Anesthesia Post Note  Patient: Gabriel Ballard  Procedure(s) Performed: LEFT RING FINGER REPAIR EXTENSOR TENDON (Left: Hand) PINNING PROXIMAL INTERPHALANGEAL JOINT (Left: Hand)     Patient location during evaluation: PACU Anesthesia Type: General Level of consciousness: awake and alert Pain management: pain level controlled Vital Signs Assessment: post-procedure vital signs reviewed and stable Respiratory status: spontaneous breathing, nonlabored ventilation and respiratory function stable Cardiovascular status: blood pressure returned to baseline Postop Assessment: no apparent nausea or vomiting Anesthetic complications: no   No notable events documented.  Last Vitals:  Vitals:   04/12/23 1400 04/12/23 1412  BP: (!) 162/97 105/80  Pulse: (!) 54 74  Resp: 11 18  Temp:  (!) 36.2 C  SpO2: 95% 96%    Last Pain:  Vitals:   04/12/23 1412  TempSrc:   PainSc: 0-No pain                 Shanda Howells

## 2023-04-12 NOTE — Anesthesia Procedure Notes (Signed)
Anesthesia Regional Block: Axillary brachial plexus block   Pre-Anesthetic Checklist: , timeout performed,  Correct Patient, Correct Site, Correct Laterality,  Correct Procedure, Correct Position, site marked,  Risks and benefits discussed,  Surgical consent,  Pre-op evaluation,  At surgeon's request and post-op pain management  Laterality: Left  Prep: Maximum Sterile Barrier Precautions used, chloraprep       Needles:  Injection technique: Single-shot  Needle Type: Echogenic Needle     Needle Length: 5cm  Needle Gauge: 22     Additional Needles:   Procedures:,,,, ultrasound used (permanent image in chart),,    Narrative:  Start time: 04/12/2023 11:00 AM End time: 04/12/2023 11:05 AM Injection made incrementally with aspirations every 5 mL.  Performed by: Personally  Anesthesiologist: Mariann Barter, MD

## 2023-04-12 NOTE — Progress Notes (Signed)
Assisted Dr Ace Gins with left, axillary, ultrasound guided block. Side rails up, monitors on throughout procedure. See vital signs in flow sheet. Tolerated Procedure well.

## 2023-04-12 NOTE — Anesthesia Preprocedure Evaluation (Addendum)
Anesthesia Evaluation  Patient identified by MRN, date of birth, ID band Patient awake    Reviewed: Allergy & Precautions, NPO status , Patient's Chart, lab work & pertinent test results, reviewed documented beta blocker date and time   History of Anesthesia Complications Negative for: history of anesthetic complications  Airway Mallampati: III  TM Distance: >3 FB     Dental no notable dental hx.    Pulmonary neg COPD, neg PE   breath sounds clear to auscultation       Cardiovascular hypertension, (-) CAD, (-) Past MI, (-) Cardiac Stents and (-) CABG  Rhythm:Regular Rate:Normal     Neuro/Psych neg Seizures    GI/Hepatic ,neg GERD  ,,(+) neg Cirrhosis        Endo/Other  neg diabetes    Renal/GU Renal disease     Musculoskeletal   Abdominal   Peds  Hematology   Anesthesia Other Findings   Reproductive/Obstetrics                             Anesthesia Physical Anesthesia Plan  ASA: 2  Anesthesia Plan: Regional and MAC   Post-op Pain Management: Regional block*   Induction: Intravenous  PONV Risk Score and Plan: 2 and Ondansetron  Airway Management Planned: Natural Airway and Nasal Cannula  Additional Equipment:   Intra-op Plan:   Post-operative Plan:   Informed Consent: I have reviewed the patients History and Physical, chart, labs and discussed the procedure including the risks, benefits and alternatives for the proposed anesthesia with the patient or authorized representative who has indicated his/her understanding and acceptance.     Dental advisory given  Plan Discussed with: CRNA  Anesthesia Plan Comments:        Anesthesia Quick Evaluation

## 2023-04-12 NOTE — Anesthesia Procedure Notes (Signed)
Procedure Name: MAC Date/Time: 04/12/2023 12:37 PM  Performed by: Yolanda Bonine, CRNAPre-anesthesia Checklist: Patient identified, Emergency Drugs available, Suction available, Patient being monitored and Timeout performed Patient Re-evaluated:Patient Re-evaluated prior to induction Oxygen Delivery Method: Simple face mask

## 2023-04-12 NOTE — H&P (Signed)
Gabriel Ballard is an 58 y.o. male.   Chief Complaint: finger laceration HPI: 58 yo male states he sustained laceration to left ring finger 03/06/23.  Has had inability to extend finger at pip joint.  He wishes to proceed with exploration and repair of extensor tendon with pinning of pip joint.  Allergies: No Known Allergies  Past Medical History:  Diagnosis Date   Arthritis    ankylosing spondolytis   Gout    Hypertension     Past Surgical History:  Procedure Laterality Date   ACHILLES TENDON SURGERY Left    ANTERIOR FUSION CERVICAL SPINE     DISTAL BICEPS TENDON REPAIR Left    EYE SURGERY     KNEE RECONSTRUCTION, MEDIAL PATELLAR FEMORAL LIGAMENT Left    SHOULDER ARTHROSCOPY W/ ROTATOR CUFF REPAIR Right    TOE ARTHROPLASTY Left    4th toe    Family History: History reviewed. No pertinent family history.  Social History:   reports that he has never smoked. He quit smokeless tobacco use about 35 years ago.  His smokeless tobacco use included chew. He reports that he does not currently use alcohol. He reports that he does not use drugs.  Medications: Medications Prior to Admission  Medication Sig Dispense Refill   allopurinol (ZYLOPRIM) 300 MG tablet   0   amLODipine (NORVASC) 5 MG tablet Take 5 mg by mouth daily.  1   indomethacin (INDOCIN SR) 75 MG CR capsule TAKE ONE CAPSULE BY MOUTH TWICE A DAY WITH MEALS  2   irbesartan (AVAPRO) 75 MG tablet   0   temazepam (RESTORIL) 30 MG capsule      traMADol (ULTRAM) 50 MG tablet TAKE 2 TABLETS BY MOUTH EVERY 8 HOURS AS NEEDED (30 DAY SUPPLY)  3   EPINEPHrine (EPIPEN 2-PAK) 0.3 mg/0.3 mL IJ SOAJ injection      Golimumab (SIMPONI ARIA IV) Inject into the vein.     HYDROcodone-acetaminophen (NORCO) 10-325 MG tablet       No results found for this or any previous visit (from the past 48 hours).  No results found.    Blood pressure (!) 132/98, pulse 70, temperature 98.1 F (36.7 C), temperature source Temporal, resp. rate  16, height 6' (1.829 m), weight 94.7 kg, SpO2 99%.  General appearance: alert, cooperative, and appears stated age Head: Normocephalic, without obvious abnormality, atraumatic Neck: supple, symmetrical, trachea midline Extremities: Intact sensation and capillary refill all digits.  +epl/fpl/io.  No wounds.  Skin: Skin color, texture, turgor normal. No rashes or lesions Neurologic: Grossly normal Incision/Wound: none  Assessment/Plan Left ring finger extensor tendon laceration.  Non operative and operative treatment options have been discussed with the patient and patient wishes to proceed with operative treatment. Risks, benefits, and alternatives of surgery have been discussed and the patient agrees with the plan of care.   Betha Loa 04/12/2023, 10:18 AM

## 2023-04-12 NOTE — Op Note (Signed)
NAME: Zacari Boies [facility] MEDICAL RECORD NO: [id number] DATE OF BIRTH: 1965/08/02 FACILITY: [facility] LOCATION: [facility] PHYSICIAN: Tami Ribas, MD   OPERATIVE REPORT   DATE OF PROCEDURE: 04/12/23    PREOPERATIVE DIAGNOSIS: Left ring finger extensor tendon laceration   POSTOPERATIVE DIAGNOSIS: Left ring finger extensor tendon laceration   PROCEDURE: Left ring finger repair of extensor tendon laceration and pinning of PIP joint.   SURGEON:  Betha Loa, M.D.   ASSISTANT: Cindee Salt, MD   ANESTHESIA:  Regional with sedation   INTRAVENOUS FLUIDS:  Per anesthesia flow sheet.   ESTIMATED BLOOD LOSS:  Minimal.   COMPLICATIONS:  None.   SPECIMENS:  none   TOURNIQUET TIME:    Total Tourniquet Time Documented: Forearm (Left) - 22 minutes Total: Forearm (Left) - 22 minutes    DISPOSITION:  Stable to PACU.   INDICATIONS: 58 year old male states he sustained a laceration to the left ring finger approximately 1 month ago.  He has been unable to extend at the PIP joint.  This is bothersome and interferes with his activities.  Risks, benefits and alternatives of surgery were discussed including the risks of blood loss, infection, damage to nerves, vessels, tendons, ligaments, bone for surgery, need for additional surgery, complications with wound healing, continued pain, stiffness, , recurrence.  He voiced understanding of these risks and elected to proceed.  OPERATIVE COURSE:  After being identified preoperatively by myself,  the patient and I agreed on the procedure and site of the procedure.  The surgical site was marked.  Surgical consent had been signed. Preoperative IV antibiotic prophylaxis was given. He was transferred to the operating room and placed on the operating table in supine position with the left upper extremity on an arm board.  Sedation was induced by the anesthesiologist. A regional block had been performed by anesthesia in preoperative holding.     Left upper extremity was prepped and draped in normal sterile orthopedic fashion.  A surgical pause was performed between the surgeons, anesthesia, and operating room staff and all were in agreement as to the patient, procedure, and site of procedure.  Tourniquet at the proximal aspect of the extremity was inflated to 250 mmHg after exsanguination of the arm with an Esmarch bandage.  Incision was made incorporating the traumatic portion of the wound that had healed over.  This was carried in subcutaneous tissues by spreading technique.  Laceration of the extensor tendon at the level of the PIP joint was found.  There was a stump of tendon at the base of the middle phalanx.  The cut edges of the tendon were debrided to remove scar and fibrous tissue.  The tendon was able to be reapproximated.  The wound and joint were copiously irrigated with sterile saline.  A 0.035 inch K wire was used to pin the PIP joint in extension.  Appropriate position of the pin was confirmed with C arm in AP and lateral projections.  The tendon was reapproximated with a 4-0 Mersilene suture in figure-of-eight fashion.  The skin was then closed with 4-0 nylon in a horizontal mattress fashion.  The pin was bent and cut short.  The wound and pin site were dressed with sterile Xeroform 4 x 4 and wrapped with Coban dressing lightly.  An AlumaFoam splint was placed and wrapped lightly with Coban dressing.  The tourniquet was deflated at 22 minutes.  Fingertips were pink with brisk capillary refill after deflation of tourniquet.  The operative  drapes were  broken down.  The patient was awoken from anesthesia safely.  He was transferred back to the stretcher and taken to PACU in stable condition.  I will see him back in the office in 1 week for postoperative followup.  I will give him a prescription for Norco 5/325 1-2 tabs PO q6 hours prn pain, dispense # 20.   Betha Loa, MD Electronically signed, 04/12/23

## 2023-04-12 NOTE — Transfer of Care (Signed)
Immediate Anesthesia Transfer of Care Note  Patient: Gabriel Ballard [facility]  Procedure(s) Performed: LEFT RING FINGER REPAIR EXTENSOR TENDON (Left: Hand) PINNING PROXIMAL INTERPHALANGEAL JOINT (Left: Hand)  Patient Location: PACU  Anesthesia Type:MAC  Level of Consciousness: drowsy  Airway & Oxygen Therapy: Patient Spontanous Breathing and Patient connected to face mask oxygen  Post-op Assessment: Report given to RN and Post -op Vital signs reviewed and stable  Post vital signs: Reviewed and stable  Last Vitals:  Vitals Value Taken Time  BP 111/78 04/12/23 1327  Temp    Pulse 59 04/12/23 1329  Resp 12 04/12/23 1328  SpO2 97 % 04/12/23 1329  Vitals shown include unfiled device data.  Last Pain:  Vitals:   04/12/23 0952  TempSrc: Temporal  PainSc: 0-No pain      Patients Stated Pain Goal: 3 (04/12/23 8469)  Complications: No notable events documented.

## 2023-04-12 NOTE — Discharge Instructions (Addendum)
Regional Anesthesia Blocks  1. You may not be able to move or feel the "blocked" extremity after a regional anesthetic block. This may last may last from 3-48 hours after placement, but it will go away. The length of time depends on the medication injected and your individual response to the medication. As the nerves start to wake up, you may experience tingling as the movement and feeling returns to your extremity. If the numbness and inability to move your extremity has not gone away after 48 hours, please call your surgeon.   2. The extremity that is blocked will need to be protected until the numbness is gone and the strength has returned. Because you cannot feel it, you will need to take extra care to avoid injury. Because it may be weak, you may have difficulty moving it or using it. You may not know what position it is in without looking at it while the block is in effect.  3. For blocks in the legs and feet, returning to weight bearing and walking needs to be done carefully. You will need to wait until the numbness is entirely gone and the strength has returned. You should be able to move your leg and foot normally before you try and bear weight or walk. You will need someone to be with you when you first try to ensure you do not fall and possibly risk injury.  4. Bruising and tenderness at the needle site are common side effects and will resolve in a few days.  5. Persistent numbness or new problems with movement should be communicated to the surgeon or the [facility] [id number] [facility] [phone]).      Post Anesthesia Home Care Instructions  Activity: Get plenty of rest for the remainder of the day. A responsible individual must stay with you for 24 hours following the procedure.  For the next 24 hours, DO NOT: -Drive a car -Advertising copywriter -Drink alcoholic beverages -Take any medication unless instructed by your physician -Make any legal  decisions or sign important papers.  Meals: Start with liquid foods such as gelatin or soup. Progress to regular foods as tolerated. Avoid greasy, spicy, heavy foods. If nausea and/or vomiting occur, drink only clear liquids until the nausea and/or vomiting subsides. Call your physician if vomiting continues.  Special Instructions/Symptoms: Your throat may feel dry or sore from the anesthesia or the breathing tube placed in your throat during surgery. If this causes discomfort, gargle with warm salt water. The discomfort should disappear within 24 hours.      [facility] Center Instructions Hand Surgery  Wound Care: Keep your hand elevated above the level of your heart.  Do not allow it to dangle by your side.  Keep the dressing dry and do not remove it unless your doctor advises you to do so.  He will usually change it at the time of your post-op visit.  Moving your fingers is advised to stimulate circulation but will depend on the site of your surgery.  If you have a splint applied, your doctor will advise you regarding movement.  Activity: Do not drive or operate machinery today.  Rest today and then you may return to your normal activity and work as indicated by your physician.  Diet:  Drink liquids today or eat a light diet.  You may resume a regular diet tomorrow.    General expectations: Pain for two to three days. Fingers may become slightly swollen.  Call your  doctor if any of the following occur: Severe pain not relieved by pain medication. Elevated temperature. Dressing soaked with blood. Inability to move fingers. White or bluish color to fingers.

## 2023-04-13 ENCOUNTER — Encounter (HOSPITAL_BASED_OUTPATIENT_CLINIC_OR_DEPARTMENT_OTHER): Payer: Self-pay | Admitting: Orthopedic Surgery

## 2023-04-23 DIAGNOSIS — S61215A Laceration without foreign body of left ring finger without damage to nail, initial encounter: Secondary | ICD-10-CM | POA: Diagnosis not present

## 2023-04-23 DIAGNOSIS — M25642 Stiffness of left hand, not elsewhere classified: Secondary | ICD-10-CM | POA: Diagnosis not present

## 2023-04-23 DIAGNOSIS — M79645 Pain in left finger(s): Secondary | ICD-10-CM | POA: Diagnosis not present

## 2023-04-24 DIAGNOSIS — R5383 Other fatigue: Secondary | ICD-10-CM | POA: Diagnosis not present

## 2023-04-24 DIAGNOSIS — Z111 Encounter for screening for respiratory tuberculosis: Secondary | ICD-10-CM | POA: Diagnosis not present

## 2023-04-24 DIAGNOSIS — M450 Ankylosing spondylitis of multiple sites in spine: Secondary | ICD-10-CM | POA: Diagnosis not present

## 2023-04-26 DIAGNOSIS — L409 Psoriasis, unspecified: Secondary | ICD-10-CM | POA: Diagnosis not present

## 2023-04-26 DIAGNOSIS — M459 Ankylosing spondylitis of unspecified sites in spine: Secondary | ICD-10-CM | POA: Diagnosis not present

## 2023-04-26 DIAGNOSIS — I10 Essential (primary) hypertension: Secondary | ICD-10-CM | POA: Diagnosis not present

## 2023-04-26 DIAGNOSIS — J0100 Acute maxillary sinusitis, unspecified: Secondary | ICD-10-CM | POA: Diagnosis not present

## 2023-04-26 DIAGNOSIS — Z125 Encounter for screening for malignant neoplasm of prostate: Secondary | ICD-10-CM | POA: Diagnosis not present

## 2023-04-27 DIAGNOSIS — M79645 Pain in left finger(s): Secondary | ICD-10-CM | POA: Diagnosis not present

## 2023-04-27 DIAGNOSIS — S61215A Laceration without foreign body of left ring finger without damage to nail, initial encounter: Secondary | ICD-10-CM | POA: Diagnosis not present

## 2023-05-15 DIAGNOSIS — M25521 Pain in right elbow: Secondary | ICD-10-CM | POA: Diagnosis not present

## 2023-05-15 DIAGNOSIS — M450 Ankylosing spondylitis of multiple sites in spine: Secondary | ICD-10-CM | POA: Diagnosis not present

## 2023-05-15 DIAGNOSIS — M1A09X0 Idiopathic chronic gout, multiple sites, without tophus (tophi): Secondary | ICD-10-CM | POA: Diagnosis not present

## 2023-05-15 DIAGNOSIS — M25561 Pain in right knee: Secondary | ICD-10-CM | POA: Diagnosis not present

## 2023-05-15 DIAGNOSIS — S66325D Laceration of extensor muscle, fascia and tendon of left ring finger at wrist and hand level, subsequent encounter: Secondary | ICD-10-CM | POA: Diagnosis not present

## 2023-05-29 DIAGNOSIS — S66325D Laceration of extensor muscle, fascia and tendon of left ring finger at wrist and hand level, subsequent encounter: Secondary | ICD-10-CM | POA: Diagnosis not present

## 2023-05-31 DIAGNOSIS — S66325D Laceration of extensor muscle, fascia and tendon of left ring finger at wrist and hand level, subsequent encounter: Secondary | ICD-10-CM | POA: Diagnosis not present

## 2023-06-06 DIAGNOSIS — S66325D Laceration of extensor muscle, fascia and tendon of left ring finger at wrist and hand level, subsequent encounter: Secondary | ICD-10-CM | POA: Diagnosis not present

## 2023-06-06 DIAGNOSIS — M79645 Pain in left finger(s): Secondary | ICD-10-CM | POA: Diagnosis not present

## 2023-06-06 DIAGNOSIS — M25642 Stiffness of left hand, not elsewhere classified: Secondary | ICD-10-CM | POA: Diagnosis not present

## 2023-06-13 DIAGNOSIS — S66325D Laceration of extensor muscle, fascia and tendon of left ring finger at wrist and hand level, subsequent encounter: Secondary | ICD-10-CM | POA: Diagnosis not present

## 2023-06-13 DIAGNOSIS — M79645 Pain in left finger(s): Secondary | ICD-10-CM | POA: Diagnosis not present

## 2023-06-13 DIAGNOSIS — M25642 Stiffness of left hand, not elsewhere classified: Secondary | ICD-10-CM | POA: Diagnosis not present

## 2023-06-19 DIAGNOSIS — M450 Ankylosing spondylitis of multiple sites in spine: Secondary | ICD-10-CM | POA: Diagnosis not present

## 2023-06-20 DIAGNOSIS — S66325D Laceration of extensor muscle, fascia and tendon of left ring finger at wrist and hand level, subsequent encounter: Secondary | ICD-10-CM | POA: Diagnosis not present

## 2023-06-20 DIAGNOSIS — M79645 Pain in left finger(s): Secondary | ICD-10-CM | POA: Diagnosis not present

## 2023-06-20 DIAGNOSIS — M25642 Stiffness of left hand, not elsewhere classified: Secondary | ICD-10-CM | POA: Diagnosis not present

## 2023-06-27 DIAGNOSIS — S66325D Laceration of extensor muscle, fascia and tendon of left ring finger at wrist and hand level, subsequent encounter: Secondary | ICD-10-CM | POA: Diagnosis not present

## 2023-06-27 DIAGNOSIS — M25642 Stiffness of left hand, not elsewhere classified: Secondary | ICD-10-CM | POA: Diagnosis not present

## 2023-06-27 DIAGNOSIS — M79645 Pain in left finger(s): Secondary | ICD-10-CM | POA: Diagnosis not present

## 2023-06-29 DIAGNOSIS — Z4789 Encounter for other orthopedic aftercare: Secondary | ICD-10-CM | POA: Diagnosis not present

## 2023-08-14 DIAGNOSIS — M450 Ankylosing spondylitis of multiple sites in spine: Secondary | ICD-10-CM | POA: Diagnosis not present

## 2023-10-09 DIAGNOSIS — M450 Ankylosing spondylitis of multiple sites in spine: Secondary | ICD-10-CM | POA: Diagnosis not present

## 2023-10-09 DIAGNOSIS — Z79899 Other long term (current) drug therapy: Secondary | ICD-10-CM | POA: Diagnosis not present

## 2023-11-01 DIAGNOSIS — N1831 Chronic kidney disease, stage 3a: Secondary | ICD-10-CM | POA: Diagnosis not present

## 2023-11-01 DIAGNOSIS — I10 Essential (primary) hypertension: Secondary | ICD-10-CM | POA: Diagnosis not present

## 2023-11-01 DIAGNOSIS — H9202 Otalgia, left ear: Secondary | ICD-10-CM | POA: Diagnosis not present

## 2023-11-01 DIAGNOSIS — F419 Anxiety disorder, unspecified: Secondary | ICD-10-CM | POA: Diagnosis not present

## 2023-11-13 DIAGNOSIS — M25521 Pain in right elbow: Secondary | ICD-10-CM | POA: Diagnosis not present

## 2023-11-13 DIAGNOSIS — M25561 Pain in right knee: Secondary | ICD-10-CM | POA: Diagnosis not present

## 2023-11-13 DIAGNOSIS — M1A09X0 Idiopathic chronic gout, multiple sites, without tophus (tophi): Secondary | ICD-10-CM | POA: Diagnosis not present

## 2023-11-13 DIAGNOSIS — M450 Ankylosing spondylitis of multiple sites in spine: Secondary | ICD-10-CM | POA: Diagnosis not present

## 2023-12-04 DIAGNOSIS — M450 Ankylosing spondylitis of multiple sites in spine: Secondary | ICD-10-CM | POA: Diagnosis not present

## 2024-01-29 DIAGNOSIS — M450 Ankylosing spondylitis of multiple sites in spine: Secondary | ICD-10-CM | POA: Diagnosis not present

## 2024-01-29 DIAGNOSIS — M1A09X0 Idiopathic chronic gout, multiple sites, without tophus (tophi): Secondary | ICD-10-CM | POA: Diagnosis not present
